# Patient Record
Sex: Female | Born: 1935 | Race: White | Hispanic: No | Marital: Single | Smoking: Never smoker
Health system: Southern US, Community
[De-identification: ages and names within clinical notes are randomized; demographics above are authoritative.]

## PROBLEM LIST (undated history)

## (undated) DIAGNOSIS — C3490 Malignant neoplasm of unspecified part of unspecified bronchus or lung: Secondary | ICD-10-CM

## (undated) DIAGNOSIS — M8580 Other specified disorders of bone density and structure, unspecified site: Secondary | ICD-10-CM

## (undated) DIAGNOSIS — N289 Disorder of kidney and ureter, unspecified: Secondary | ICD-10-CM

## (undated) DIAGNOSIS — H349 Unspecified retinal vascular occlusion: Secondary | ICD-10-CM

## (undated) DIAGNOSIS — E213 Hyperparathyroidism, unspecified: Secondary | ICD-10-CM

## (undated) DIAGNOSIS — J449 Chronic obstructive pulmonary disease, unspecified: Secondary | ICD-10-CM

## (undated) DIAGNOSIS — E7800 Pure hypercholesterolemia, unspecified: Secondary | ICD-10-CM

## (undated) DIAGNOSIS — I701 Atherosclerosis of renal artery: Secondary | ICD-10-CM

## (undated) DIAGNOSIS — I2510 Atherosclerotic heart disease of native coronary artery without angina pectoris: Secondary | ICD-10-CM

## (undated) DIAGNOSIS — G4733 Obstructive sleep apnea (adult) (pediatric): Secondary | ICD-10-CM

## (undated) DIAGNOSIS — I10 Essential (primary) hypertension: Secondary | ICD-10-CM

## (undated) HISTORY — PX: LUNG REMOVAL, PARTIAL: SHX233

## (undated) HISTORY — PX: ABDOMINAL HYSTERECTOMY: SHX81

## (undated) HISTORY — PX: ADRENAL GLAND SURGERY: SHX544

## (undated) HISTORY — PX: TONSILLECTOMY: SUR1361

## (undated) HISTORY — PX: EYE SURGERY: SHX253

## (undated) HISTORY — PX: CORONARY ANGIOPLASTY: SHX604

## (undated) HISTORY — PX: OTHER SURGICAL HISTORY: SHX169

---

## 2016-04-11 ENCOUNTER — Inpatient Hospital Stay
Admission: EM | Admit: 2016-04-11 | Discharge: 2016-04-16 | DRG: 190 | Disposition: A | Discharge status: Skilled Nursing Facility | Payer: MEDICARE | Attending: Internal Medicine | Admitting: Internal Medicine

## 2016-04-11 ENCOUNTER — Emergency Department: Payer: MEDICARE

## 2016-04-11 DIAGNOSIS — G4733 Obstructive sleep apnea (adult) (pediatric): Secondary | ICD-10-CM | POA: Diagnosis not present

## 2016-04-11 DIAGNOSIS — Z880 Allergy status to penicillin: Secondary | ICD-10-CM

## 2016-04-11 DIAGNOSIS — E213 Hyperparathyroidism, unspecified: Secondary | ICD-10-CM | POA: Diagnosis present

## 2016-04-11 DIAGNOSIS — R000 Tachycardia, unspecified: Secondary | ICD-10-CM | POA: Diagnosis present

## 2016-04-11 DIAGNOSIS — Z7951 Long term (current) use of inhaled steroids: Secondary | ICD-10-CM

## 2016-04-11 DIAGNOSIS — I517 Cardiomegaly: Secondary | ICD-10-CM | POA: Diagnosis not present

## 2016-04-11 DIAGNOSIS — J9621 Acute and chronic respiratory failure with hypoxia: Secondary | ICD-10-CM | POA: Diagnosis present

## 2016-04-11 DIAGNOSIS — I2510 Atherosclerotic heart disease of native coronary artery without angina pectoris: Secondary | ICD-10-CM | POA: Diagnosis present

## 2016-04-11 DIAGNOSIS — Z66 Do not resuscitate: Secondary | ICD-10-CM | POA: Diagnosis not present

## 2016-04-11 DIAGNOSIS — N183 Chronic kidney disease, stage 3 (moderate): Secondary | ICD-10-CM | POA: Diagnosis not present

## 2016-04-11 DIAGNOSIS — Z90710 Acquired absence of both cervix and uterus: Secondary | ICD-10-CM

## 2016-04-11 DIAGNOSIS — E7800 Pure hypercholesterolemia, unspecified: Secondary | ICD-10-CM | POA: Diagnosis present

## 2016-04-11 DIAGNOSIS — J440 Chronic obstructive pulmonary disease with acute lower respiratory infection: Secondary | ICD-10-CM | POA: Diagnosis present

## 2016-04-11 DIAGNOSIS — J441 Chronic obstructive pulmonary disease with (acute) exacerbation: Secondary | ICD-10-CM | POA: Diagnosis not present

## 2016-04-11 DIAGNOSIS — Z881 Allergy status to other antibiotic agents status: Secondary | ICD-10-CM

## 2016-04-11 DIAGNOSIS — I447 Left bundle-branch block, unspecified: Secondary | ICD-10-CM | POA: Diagnosis not present

## 2016-04-11 DIAGNOSIS — J209 Acute bronchitis, unspecified: Secondary | ICD-10-CM | POA: Diagnosis not present

## 2016-04-11 DIAGNOSIS — Z8249 Family history of ischemic heart disease and other diseases of the circulatory system: Secondary | ICD-10-CM

## 2016-04-11 DIAGNOSIS — F329 Major depressive disorder, single episode, unspecified: Secondary | ICD-10-CM | POA: Diagnosis present

## 2016-04-11 DIAGNOSIS — Z7982 Long term (current) use of aspirin: Secondary | ICD-10-CM | POA: Diagnosis not present

## 2016-04-11 DIAGNOSIS — Z9981 Dependence on supplemental oxygen: Secondary | ICD-10-CM

## 2016-04-11 DIAGNOSIS — Z85118 Personal history of other malignant neoplasm of bronchus and lung: Secondary | ICD-10-CM

## 2016-04-11 DIAGNOSIS — R0602 Shortness of breath: Secondary | ICD-10-CM

## 2016-04-11 DIAGNOSIS — E785 Hyperlipidemia, unspecified: Secondary | ICD-10-CM | POA: Diagnosis present

## 2016-04-11 DIAGNOSIS — M8580 Other specified disorders of bone density and structure, unspecified site: Secondary | ICD-10-CM | POA: Diagnosis not present

## 2016-04-11 DIAGNOSIS — I440 Atrioventricular block, first degree: Secondary | ICD-10-CM | POA: Diagnosis present

## 2016-04-11 DIAGNOSIS — M6281 Muscle weakness (generalized): Secondary | ICD-10-CM

## 2016-04-11 DIAGNOSIS — I129 Hypertensive chronic kidney disease with stage 1 through stage 4 chronic kidney disease, or unspecified chronic kidney disease: Secondary | ICD-10-CM | POA: Diagnosis present

## 2016-04-11 DIAGNOSIS — J9600 Acute respiratory failure, unspecified whether with hypoxia or hypercapnia: Secondary | ICD-10-CM

## 2016-04-11 DIAGNOSIS — I701 Atherosclerosis of renal artery: Secondary | ICD-10-CM | POA: Diagnosis present

## 2016-04-11 DIAGNOSIS — Z888 Allergy status to other drugs, medicaments and biological substances status: Secondary | ICD-10-CM

## 2016-04-11 HISTORY — DX: Atherosclerotic heart disease of native coronary artery without angina pectoris: I25.10

## 2016-04-11 HISTORY — DX: Essential (primary) hypertension: I10

## 2016-04-11 HISTORY — DX: Unspecified retinal vascular occlusion: H34.9

## 2016-04-11 HISTORY — DX: Malignant neoplasm of unspecified part of unspecified bronchus or lung: C34.90

## 2016-04-11 HISTORY — DX: Pure hypercholesterolemia, unspecified: E78.00

## 2016-04-11 HISTORY — DX: Chronic obstructive pulmonary disease, unspecified: J44.9

## 2016-04-11 HISTORY — DX: Atherosclerosis of renal artery: I70.1

## 2016-04-11 HISTORY — DX: Other specified disorders of bone density and structure, unspecified site: M85.80

## 2016-04-11 HISTORY — DX: Hyperparathyroidism, unspecified: E21.3

## 2016-04-11 HISTORY — DX: Disorder of kidney and ureter, unspecified: N28.9

## 2016-04-11 HISTORY — DX: Obstructive sleep apnea (adult) (pediatric): G47.33

## 2016-04-11 LAB — CBC
HEMATOCRIT: 39 % (ref 35.0–47.0)
Hemoglobin: 13.1 g/dL (ref 12.0–16.0)
MCH: 30.6 pg (ref 26.0–34.0)
MCHC: 33.7 g/dL (ref 32.0–36.0)
MCV: 90.9 fL (ref 80.0–100.0)
Platelets: 165 10*3/uL (ref 150–440)
RBC: 4.29 MIL/uL (ref 3.80–5.20)
RDW: 16.3 % — ABNORMAL HIGH (ref 11.5–14.5)
WBC: 8.7 10*3/uL (ref 3.6–11.0)

## 2016-04-11 LAB — COMPREHENSIVE METABOLIC PANEL
ALBUMIN: 3.9 g/dL (ref 3.5–5.0)
ALT: 20 U/L (ref 14–54)
ANION GAP: 8 (ref 5–15)
AST: 30 U/L (ref 15–41)
Alkaline Phosphatase: 70 U/L (ref 38–126)
BILIRUBIN TOTAL: 0.6 mg/dL (ref 0.3–1.2)
BUN: 32 mg/dL — ABNORMAL HIGH (ref 6–20)
CO2: 31 mmol/L (ref 22–32)
Calcium: 9.8 mg/dL (ref 8.9–10.3)
Chloride: 104 mmol/L (ref 101–111)
Creatinine, Ser: 1 mg/dL (ref 0.44–1.00)
GFR, EST AFRICAN AMERICAN: 58 mL/min — AB (ref >60)
GFR, EST NON AFRICAN AMERICAN: 50 mL/min — AB (ref >60)
Glucose, Bld: 93 mg/dL (ref 65–99)
POTASSIUM: 4.3 mmol/L (ref 3.5–5.1)
Sodium: 143 mmol/L (ref 135–145)
TOTAL PROTEIN: 6.7 g/dL (ref 6.5–8.1)

## 2016-04-11 LAB — INFLUENZA PANEL BY PCR (TYPE A & B)
INFLAPCR: NEGATIVE
INFLBPCR: NEGATIVE

## 2016-04-11 LAB — BRAIN NATRIURETIC PEPTIDE: B NATRIURETIC PEPTIDE 5: 146 pg/mL — AB (ref 0.0–100.0)

## 2016-04-11 LAB — TROPONIN I: Troponin I: <0.03 ng/mL (ref <0.03)

## 2016-04-11 MED ORDER — PROSIGHT PO TABS
1.0000 | ORAL_TABLET | Freq: Every day | ORAL | Status: DC
  Filled 2016-04-11: qty 1

## 2016-04-11 MED ORDER — SODIUM CHLORIDE 0.9% FLUSH: 3.0000 mL | INTRAVENOUS | Status: DC | PRN

## 2016-04-11 MED ORDER — SODIUM CHLORIDE 0.9% FLUSH
3.0000 mL | Freq: Two times a day (BID) | INTRAVENOUS | Status: DC
  Administered 2016-04-11 – 2016-04-16 (×10): 3 mL via INTRAVENOUS

## 2016-04-11 MED ORDER — MAGNESIUM OXIDE 400 MG PO TABS
400.0000 mg | ORAL_TABLET | Freq: Every day | ORAL | Status: DC
  Administered 2016-04-13: 09:00:00 400 mg via ORAL
  Filled 2016-04-11 (×11): qty 1

## 2016-04-11 MED ORDER — BUDESONIDE 0.25 MG/2ML IN SUSP
0.2500 mg | Freq: Two times a day (BID) | RESPIRATORY_TRACT | Status: DC
  Administered 2016-04-11 – 2016-04-16 (×11): 0.25 mg via RESPIRATORY_TRACT
  Filled 2016-04-11 (×11): qty 2

## 2016-04-11 MED ORDER — SODIUM CHLORIDE 0.9% FLUSH
3.0000 mL | Freq: Two times a day (BID) | INTRAVENOUS | Status: DC
  Administered 2016-04-12 – 2016-04-16 (×5): 3 mL via INTRAVENOUS

## 2016-04-11 MED ORDER — ADULT MULTIVITAMIN W/MINERALS CH
1.0000 | ORAL_TABLET | Freq: Every day | ORAL | Status: DC
  Administered 2016-04-11 – 2016-04-16 (×5): 1 via ORAL
  Filled 2016-04-11 (×5): qty 1

## 2016-04-11 MED ORDER — METHYLPREDNISOLONE SODIUM SUCC 125 MG IJ SOLR
125.0000 mg | Freq: Once | INTRAMUSCULAR | Status: AC
  Administered 2016-04-11: 125 mg via INTRAVENOUS
  Filled 2016-04-11: qty 2

## 2016-04-11 MED ORDER — TIOTROPIUM BROMIDE MONOHYDRATE 18 MCG IN CAPS
1.0000 | ORAL_CAPSULE | Freq: Every day | RESPIRATORY_TRACT | Status: DC
  Administered 2016-04-11 – 2016-04-14 (×4): 18 ug via RESPIRATORY_TRACT
  Filled 2016-04-11: qty 5

## 2016-04-11 MED ORDER — ASPIRIN EC 81 MG PO TBEC
81.0000 mg | DELAYED_RELEASE_TABLET | Freq: Every day | ORAL | Status: DC
  Administered 2016-04-11 – 2016-04-16 (×6): 81 mg via ORAL
  Filled 2016-04-11 (×6): qty 1

## 2016-04-11 MED ORDER — MULTI-VITAMINS PO TABS: 1.0000 | ORAL_TABLET | Freq: Every day | ORAL | Status: DC

## 2016-04-11 MED ORDER — ACETAMINOPHEN 650 MG RE SUPP: 650.0000 mg | Freq: Four times a day (QID) | RECTAL | Status: DC | PRN

## 2016-04-11 MED ORDER — ONDANSETRON HCL 4 MG/2ML IJ SOLN: 4.0000 mg | Freq: Four times a day (QID) | INTRAMUSCULAR | Status: DC | PRN

## 2016-04-11 MED ORDER — SERTRALINE HCL 50 MG PO TABS
25.0000 mg | ORAL_TABLET | Freq: Every day | ORAL | Status: DC
  Administered 2016-04-11 – 2016-04-16 (×6): 25 mg via ORAL
  Filled 2016-04-11 (×6): qty 1

## 2016-04-11 MED ORDER — LEVALBUTEROL HCL 1.25 MG/0.5ML IN NEBU
1.2500 mg | INHALATION_SOLUTION | Freq: Four times a day (QID) | RESPIRATORY_TRACT | Status: DC
  Administered 2016-04-11 – 2016-04-14 (×13): 1.25 mg via RESPIRATORY_TRACT
  Filled 2016-04-11 (×13): qty 0.5

## 2016-04-11 MED ORDER — IPRATROPIUM-ALBUTEROL 0.5-2.5 (3) MG/3ML IN SOLN
3.0000 mL | Freq: Once | RESPIRATORY_TRACT | Status: AC
  Administered 2016-04-11: 3 mL via RESPIRATORY_TRACT
  Filled 2016-04-11: qty 3

## 2016-04-11 MED ORDER — SODIUM CHLORIDE 0.9 % IV SOLN: 250.0000 mL | INTRAVENOUS | Status: DC | PRN

## 2016-04-11 MED ORDER — CEPASTAT 14.5 MG MT LOZG
1.0000 | LOZENGE | OROMUCOSAL | Status: DC | PRN
Start: 2016-04-11 — End: 2016-04-16
  Administered 2016-04-11 – 2016-04-16 (×4): 1 via BUCCAL
  Filled 2016-04-11: qty 9

## 2016-04-11 MED ORDER — METOPROLOL SUCCINATE ER 25 MG PO TB24
50.0000 mg | ORAL_TABLET | Freq: Every day | ORAL | Status: DC
  Administered 2016-04-11 – 2016-04-16 (×6): 50 mg via ORAL
  Filled 2016-04-11 (×6): qty 2

## 2016-04-11 MED ORDER — ENOXAPARIN SODIUM 40 MG/0.4ML ~~LOC~~ SOLN
40.0000 mg | SUBCUTANEOUS | Status: DC
  Administered 2016-04-11 – 2016-04-14 (×4): 40 mg via SUBCUTANEOUS
  Filled 2016-04-11 (×4): qty 0.4

## 2016-04-11 MED ORDER — ACETYLCYSTEINE 20 % IN SOLN
4.0000 mL | Freq: Two times a day (BID) | RESPIRATORY_TRACT | Status: DC
  Administered 2016-04-11: 20:00:00 4 mL via RESPIRATORY_TRACT
  Filled 2016-04-11 (×3): qty 4

## 2016-04-11 MED ORDER — NITROGLYCERIN 0.4 MG SL SUBL
0.4000 mg | SUBLINGUAL_TABLET | SUBLINGUAL | Status: DC | PRN
  Administered 2016-04-12 (×2): 0.4 mg via SUBLINGUAL
  Filled 2016-04-11: qty 1

## 2016-04-11 MED ORDER — DOXYCYCLINE HYCLATE 100 MG PO TABS
100.0000 mg | ORAL_TABLET | Freq: Two times a day (BID) | ORAL | Status: DC
  Administered 2016-04-11 – 2016-04-16 (×11): 100 mg via ORAL
  Filled 2016-04-11 (×11): qty 1

## 2016-04-11 MED ORDER — ATORVASTATIN CALCIUM 20 MG PO TABS
80.0000 mg | ORAL_TABLET | Freq: Every day | ORAL | Status: DC
  Administered 2016-04-11 – 2016-04-15 (×5): 80 mg via ORAL
  Filled 2016-04-11 (×5): qty 4

## 2016-04-11 MED ORDER — ACETAMINOPHEN 325 MG PO TABS
650.0000 mg | ORAL_TABLET | Freq: Four times a day (QID) | ORAL | Status: DC | PRN
  Administered 2016-04-11 – 2016-04-12 (×2): 650 mg via ORAL
  Filled 2016-04-11 (×2): qty 2

## 2016-04-11 MED ORDER — AMLODIPINE BESYLATE 5 MG PO TABS
5.0000 mg | ORAL_TABLET | Freq: Every day | ORAL | Status: DC
  Administered 2016-04-11 – 2016-04-16 (×6): 5 mg via ORAL
  Filled 2016-04-11 (×6): qty 1

## 2016-04-11 MED ORDER — GUAIFENESIN-CODEINE 100-10 MG/5ML PO SOLN
10.0000 mL | ORAL | Status: DC | PRN
  Administered 2016-04-11 – 2016-04-15 (×10): 10 mL via ORAL
  Filled 2016-04-11 (×10): qty 10

## 2016-04-11 MED ORDER — METHYLPREDNISOLONE SODIUM SUCC 40 MG IJ SOLR
40.0000 mg | Freq: Three times a day (TID) | INTRAMUSCULAR | Status: DC
  Administered 2016-04-11 – 2016-04-12 (×3): 40 mg via INTRAVENOUS
  Filled 2016-04-11 (×3): qty 1

## 2016-04-11 MED ORDER — ONDANSETRON HCL 4 MG PO TABS: 4.0000 mg | ORAL_TABLET | Freq: Four times a day (QID) | ORAL | Status: DC | PRN

## 2016-04-11 MED ORDER — SPIRONOLACTONE 25 MG PO TABS
25.0000 mg | ORAL_TABLET | Freq: Every day | ORAL | Status: DC
  Administered 2016-04-11 – 2016-04-13 (×3): 25 mg via ORAL
  Filled 2016-04-11 (×3): qty 1

## 2016-04-11 NOTE — ED Triage Notes (Signed)
Pt arrives to ER via POV c/o SOB and pt states she is having COPD exacerbation. Pt saw PCP Friday and given steroids, breathing tx and instructed to come to ER if she got worse. Pt wearing 4L [place] upon arrival.

## 2016-04-11 NOTE — ED Provider Notes (Signed)
[facility] Emergency Department Provider Note ____________________________________________   I have reviewed the triage vital signs and the triage nursing note.  HISTORY  Chief Complaint Shortness of Breath   Historian Patient  HPI Mariah Gross is a 83 y.o. female with a history of COPD and follows at [facility], states that she saw her primary doctor 2 days ago and was started on erythromycin, and prednisone taper for which she is now taken 2 days but is getting worse home. Worsening shortness of breath and wheezing/coughing episodes which take her breath away. She is on home 3 L oxygen at baseline, and with exertion she will sometimes take 4 L.  Symptoms are worsening at home despite home treatment for 2 days. The patient therapy of COPD exacerbation.  Symptoms worse with any kind of exertion, and also lying flat.    Past Medical History:  Diagnosis Date  . Cancer ([place])   . COPD (chronic obstructive pulmonary disease) ([place])   . Coronary artery disease   . Hypercholesteremia   . Hyperparathyroidism ([place])   . Hypertension   . Osteopenia   . Renal disorder     There are no active problems to display for this patient.   History reviewed. No pertinent surgical history.  Prior to Admission medications   Medication Sig Start Date End Date Taking? Authorizing Provider  albuterol (PROVENTIL) (2.5 MG/3ML) 0.083% nebulizer solution Inhale 3 mLs into the lungs every 6 (six) hours as needed. 04/09/16  Yes Historical Provider, MD  amLODipine (NORVASC) 5 MG tablet Take 5 mg by mouth daily. 03/22/16  Yes Historical Provider, MD  aspirin EC 81 MG tablet Take 81 mg by mouth daily.   Yes Historical Provider, MD  atorvastatin (LIPITOR) 80 MG tablet Take 80 mg by mouth daily. 12/23/15  Yes Historical Provider, MD  azithromycin (ZITHROMAX) 250 MG tablet Take 1 tablet by mouth daily. Take 2 tabs on day one, and 1 tab daily for 4 days 04/09/16 04/14/16 Yes Historical Provider,  MD  Fluticasone-Salmeterol (ADVAIR DISKUS) 250-50 MCG/DOSE AEPB Inhale 2 puffs into the lungs every 12 (twelve) hours. 01/14/15  Yes Historical Provider, MD  magnesium oxide (MAG-OX) 400 MG tablet Take 400 mg by mouth daily. 02/01/16 01/31/17 Yes Historical Provider, MD  metoprolol succinate (TOPROL-XL) 50 MG 24 hr tablet Take 50 mg by mouth daily. 06/23/15 06/22/16 Yes Historical Provider, MD  Multiple Vitamin (MULTI-VITAMINS) TABS Take 1 tablet by mouth daily.   Yes Historical Provider, MD  nitroGLYCERIN (NITROSTAT) 0.4 MG SL tablet Place 0.4 mg under the tongue every 5 (five) minutes x 3 doses as needed. 01/22/13 06/24/16 Yes Historical Provider, MD  predniSONE (DELTASONE) 10 MG tablet Take 4 tabs daily for 3 days, then 3 tabs daily x 3 days, then 2 tabs daily for 3 days, then 1 tab daily x 3 days. 04/09/16 04/19/16 Yes Historical Provider, MD  sertraline (ZOLOFT) 50 MG tablet Take 25 mg by mouth daily.  02/03/16 08/01/16 Yes Historical Provider, MD  spironolactone (ALDACTONE) 25 MG tablet Take 25 mg by mouth daily. 01/16/16 01/15/17 Yes Historical Provider, MD  tiotropium (SPIRIVA) 18 MCG inhalation capsule Place 1 capsule into inhaler and inhale daily. 03/17/16 03/17/17 Yes Historical Provider, MD    Allergies  Allergen Reactions  . Ace Inhibitors   . Fluconazole   . Levaquin [Levofloxacin In D5w]   . Penicillins     No family history on file.  Social History Social History  Substance Use Topics  . Smoking status: Never  Smoker  . Smokeless tobacco: Never Used  . Alcohol use No    Review of Systems  Constitutional: Negative for fever. Eyes: Negative for visual changes. ENT: Negative for sore throat. Cardiovascular: Negative for chest pain. Respiratory: Positive for shortness of breath. Gastrointestinal: Negative for abdominal pain, vomiting and diarrhea. Genitourinary: Negative for dysuria. Musculoskeletal: Negative for back pain. Skin: Negative for rash. Neurological: Negative for  headache. 10 point Review of Systems otherwise negative ____________________________________________   PHYSICAL EXAM:  VITAL SIGNS: ED Triage Vitals [04/11/16 0730]  Enc Vitals Group     BP (!) 163/88     Pulse Rate 92     Resp (!) 24     Temp 97.8 F (36.6 C)     Temp Source Oral     SpO2 99 %     Weight 199 lb (90.3 kg)     Height '5\' 3"'$  (1.6 m)     Head Circumference      Peak Flow      Pain Score 5     Pain Loc      Pain Edu?      Excl. in [place]?      Constitutional: Alert and oriented. No thyroid distress at rest until she goes into a coughing/wheezing. It. HEENT   Head: Normocephalic and atraumatic.      Eyes: Conjunctivae are normal. PERRL. Normal extraocular movements.      Ears:         Nose: No congestion/rhinnorhea.   Mouth/Throat: Mucous membranes are moist.   Neck: No stridor. Cardiovascular/Chest: Normal rate, regular rhythm.  No murmurs, rubs, or gallops. Respiratory: Normal respiratory effort without tachypnea nor retractions.  She does however have moderate to severe wheezing especially when she starts coughing she is gasping to catch her breath. Gastrointestinal: Soft. No distention, no guarding, no rebound. Nontender.   Genitourinary/rectal:Deferred Musculoskeletal: Nontender with normal range of motion in all extremities. No joint effusions.  No lower extremity tenderness. Trace lower extremity edema. Neurologic:  Normal speech and language. No gross or focal neurologic deficits are appreciated. Skin:  Skin is warm, dry and intact. No rash noted. Psychiatric: Mood and affect are normal. Speech and behavior are normal. Patient exhibits appropriate insight and judgment.   ____________________________________________  LABS (pertinent positives/negatives)  Labs Reviewed  CBC - Abnormal; Notable for the following:       Result Value   RDW 16.3 (*)    All other components within normal limits  COMPREHENSIVE METABOLIC PANEL - Abnormal; Notable  for the following:    BUN 32 (*)    GFR calc non Af Amer 50 (*)    GFR calc Af Amer 58 (*)    All other components within normal limits  BRAIN NATRIURETIC PEPTIDE - Abnormal; Notable for the following:    B Natriuretic Peptide 146.0 (*)    All other components within normal limits  TROPONIN I  INFLUENZA PANEL BY PCR (TYPE A & B, H1N1)    ____________________________________________    EKG I, Lisa Roca, MD, the attending physician have personally viewed and interpreted all ECGs.  83 bpm. Normal sinus rhythm with first degree AV block. Occasional PVCs. Left axis deviation. ____________________________________________  RADIOLOGY All Xrays were viewed by me. Imaging interpreted by Radiologist.  Chest x-ray:  IMPRESSION: COPD/chronic bronchitis.  Cardiomegaly without congestive failure.  Trace right pleural fluid or thickening with adjacent minimal airspace disease, favored to represent atelectasis. __________________________________________  PROCEDURES  Procedure(s) performed: None  Critical Care performed: None  ____________________________________________   ED COURSE / ASSESSMENT AND PLAN  Pertinent labs & imaging results that were available during my care of the patient were reviewed by me and considered in my medical decision making (see chart for details).   Ms. Drema Dallas is here with COPD exacerbation, failing outpatient therapy with persistent and worsening dyspnea and wheezing despite 2 days already of outpatient steroids and erythromycin.  She is on home 3 L nasal cannula and is 97%, but does go into spells of wheezing which is extremely chondral. She is hardly able to get up off the side of bed without having severe shortness of breath.  I am adding on influenza testing although she is denying fevers or body aches.  In terms of treatment in the ER, she is receiving Solu-Medrol and 3 DuoNeb treatments.   CONSULTATIONS:   Hospitalist for  admission.   Patient / Family / Caregiver informed of clinical course, medical decision-making process, and agree with plan.  ___________________________________________   FINAL CLINICAL IMPRESSION(S) / ED DIAGNOSES   Final diagnoses:  COPD exacerbation ([place])              Note: This dictation was prepared with Dragon dictation. Any transcriptional errors that result from this process are unintentional    Lisa Roca, MD 04/11/16 1007

## 2016-04-11 NOTE — ED Notes (Signed)
Pt transported to xray 

## 2016-04-11 NOTE — H&P (Signed)
[place] [facility] [place] NAME: Mariah Gross    MR#:  [id number]  DATE OF BIRTH:  [birth year]  DATE OF ADMISSION:  04/11/2016  PRIMARY CARE PHYSICIAN: Marcelina Morel, MD   REQUESTING/REFERRING PHYSICIAN: Dr. Lisa Roca   CHIEF COMPLAINT:   Chief Complaint  Patient presents with  . Shortness of Breath    HISTORY OF PRESENT ILLNESS: Anniece Gross  is a 83 y.o. female with a known history of COPD on chronic oxygen therapy ranging from 3-4 L based on activity, coronary artery disease, hypercholesterolemia, hyperparathyroidism, essential hypertension, history lungs cancer, obstructive sleep apnea  intolerant with CPAP was coming to the hospital with complaining of shortness of breath ongoing for the past 3-4 days. Patient has had progressive shortness of breath at rest and activity. She also describes a rattling sound in her lungs. She has had some cough which is yellow in color. She also complains of swelling of her lower extremity. Denies any nausea vomiting or diarrhea. She complains of chest pain with coughing PAST MEDICAL HISTORY:   Past Medical History:  Diagnosis Date  . COPD (chronic obstructive pulmonary disease) ([place])   . Coronary artery disease   . Hypercholesteremia   . Hyperparathyroidism ([place])   . Hypertension   . Lung cancer ([place])   . OSA (obstructive sleep apnea)   . Osteopenia   . Renal artery stenosis ([place])   . Renal disorder   . Retinal artery occlusion     PAST SURGICAL HISTORY: Past Surgical History:  Procedure Laterality Date  . ABDOMINAL HYSTERECTOMY    . ADRENAL GLAND SURGERY    . CORONARY ANGIOPLASTY    . EYE SURGERY    . hemmroid    . knee replacment Right   . LUNG REMOVAL, PARTIAL    . renal stone    . TONSILLECTOMY      SOCIAL HISTORY:  Social History  Substance Use Topics  . Smoking status: Never Smoker  . Smokeless tobacco: Never Used  . Alcohol use No    FAMILY HISTORY:  Family History  Problem  Relation Age of Onset  . Hypertension Father     DRUG ALLERGIES:  Allergies  Allergen Reactions  . Ace Inhibitors   . Fluconazole   . Levaquin [Levofloxacin In D5w]   . Penicillins     REVIEW OF SYSTEMS:   CONSTITUTIONAL: No fever, fatigue or weakness.  EYES: No blurred or double vision.  EARS, NOSE, AND THROAT: No tinnitus or ear pain.  RESPIRATORY: Positive cough, positive shortness of breath, positive wheezing no hemoptysis.  CARDIOVASCULAR: Positive with coughing chest pain, positive orthopnea, positive edema.  GASTROINTESTINAL: No nausea, vomiting, diarrhea or abdominal pain.  GENITOURINARY: No dysuria, hematuria.  ENDOCRINE: No polyuria, nocturia,  HEMATOLOGY: No anemia, easy bruising or bleeding SKIN: No rash or lesion. MUSCULOSKELETAL: No joint pain or arthritis.   NEUROLOGIC: No tingling, numbness, weakness.  PSYCHIATRY: No anxiety or depression.   MEDICATIONS AT HOME:  Prior to Admission medications   Medication Sig Start Date End Date Taking? Authorizing Provider  albuterol (PROVENTIL) (2.5 MG/3ML) 0.083% nebulizer solution Inhale 3 mLs into the lungs every 6 (six) hours as needed. 04/09/16  Yes Historical Provider, MD  amLODipine (NORVASC) 5 MG tablet Take 5 mg by mouth daily. 03/22/16  Yes Historical Provider, MD  aspirin EC 81 MG tablet Take 81 mg by mouth daily.   Yes Historical Provider, MD  atorvastatin (LIPITOR) 80 MG tablet Take 80 mg by  mouth daily. 12/23/15  Yes Historical Provider, MD  azithromycin (ZITHROMAX) 250 MG tablet Take 1 tablet by mouth daily. Take 2 tabs on day one, and 1 tab daily for 4 days 04/09/16 04/14/16 Yes Historical Provider, MD  Fluticasone-Salmeterol (ADVAIR DISKUS) 250-50 MCG/DOSE AEPB Inhale 2 puffs into the lungs every 12 (twelve) hours. 01/14/15  Yes Historical Provider, MD  magnesium oxide (MAG-OX) 400 MG tablet Take 400 mg by mouth daily. 02/01/16 01/31/17 Yes Historical Provider, MD  metoprolol succinate (TOPROL-XL) 50 MG 24 hr  tablet Take 50 mg by mouth daily. 06/23/15 06/22/16 Yes Historical Provider, MD  Multiple Vitamin (MULTI-VITAMINS) TABS Take 1 tablet by mouth daily.   Yes Historical Provider, MD  nitroGLYCERIN (NITROSTAT) 0.4 MG SL tablet Place 0.4 mg under the tongue every 5 (five) minutes x 3 doses as needed. 01/22/13 06/24/16 Yes Historical Provider, MD  predniSONE (DELTASONE) 10 MG tablet Take 4 tabs daily for 3 days, then 3 tabs daily x 3 days, then 2 tabs daily for 3 days, then 1 tab daily x 3 days. 04/09/16 04/19/16 Yes Historical Provider, MD  sertraline (ZOLOFT) 50 MG tablet Take 25 mg by mouth daily.  02/03/16 08/01/16 Yes Historical Provider, MD  spironolactone (ALDACTONE) 25 MG tablet Take 25 mg by mouth daily. 01/16/16 01/15/17 Yes Historical Provider, MD  tiotropium (SPIRIVA) 18 MCG inhalation capsule Place 1 capsule into inhaler and inhale daily. 03/17/16 03/17/17 Yes Historical Provider, MD      PHYSICAL EXAMINATION:   VITAL SIGNS: Blood pressure (!) 176/108, pulse 79, temperature 97.8 F (36.6 C), temperature source Oral, resp. rate (!) 21, height '5\' 3"'$  (1.6 m), weight 199 lb (90.3 kg), SpO2 97 %.  GENERAL:  83 y.o.-year-old patient lying in the bed with no acute distress.  EYES: Pupils equal, round, reactive to light and accommodation. No scleral icterus. Extraocular muscles intact.  HEENT: Head atraumatic, normocephalic. Oropharynx and nasopharynx clear.  NECK:  Supple, no jugular venous distention. No thyroid enlargement, no tenderness.  LUNGS: Bilateral wheezing and rhonchus breath sounds throughout both lungs some associated muscle usage CARDIOVASCULAR: S1, S2 normal. No murmurs, rubs, or gallops.  ABDOMEN: Soft, nontender, nondistended. Bowel sounds present. No organomegaly or mass.  EXTREMITIES:  cyanosis, or clubbing. 1+ pedal edema NEUROLOGIC: Cranial nerves II through XII are intact. Muscle strength 5/5 in all extremities. Sensation intact. Gait not checked.  PSYCHIATRIC: The patient is  alert and oriented x 3.  SKIN: No obvious rash, lesion, or ulcer.   LABORATORY PANEL:   CBC  Recent Labs Lab 04/11/16 0748  WBC 8.7  HGB 13.1  HCT 39.0  PLT 165  MCV 90.9  MCH 30.6  MCHC 33.7  RDW 16.3*   ------------------------------------------------------------------------------------------------------------------  Chemistries   Recent Labs Lab 04/11/16 0748  NA 143  K 4.3  CL 104  CO2 31  GLUCOSE 93  BUN 32*  CREATININE 1.00  CALCIUM 9.8  AST 30  ALT 20  ALKPHOS 70  BILITOT 0.6   ------------------------------------------------------------------------------------------------------------------ estimated creatinine clearance is 44.7 mL/min (by C-G formula based on SCr of 1 mg/dL). ------------------------------------------------------------------------------------------------------------------ No results for input(s): TSH, T4TOTAL, T3FREE, THYROIDAB in the last 72 hours.  Invalid input(s): FREET3   Coagulation profile No results for input(s): INR, PROTIME in the last 168 hours. ------------------------------------------------------------------------------------------------------------------- No results for input(s): DDIMER in the last 72 hours. -------------------------------------------------------------------------------------------------------------------  Cardiac Enzymes  Recent Labs Lab 04/11/16 0748  TROPONINI <0.03   ------------------------------------------------------------------------------------------------------------------ Invalid input(s): POCBNP  ---------------------------------------------------------------------------------------------------------------  Urinalysis No results found for: COLORURINE, APPEARANCEUR, LABSPEC,  PHURINE, GLUCOSEU, HGBUR, BILIRUBINUR, KETONESUR, PROTEINUR, UROBILINOGEN, NITRITE, LEUKOCYTESUR   RADIOLOGY: Dg Chest 2 View  Result Date: 04/11/2016 CLINICAL DATA:  c/o SOB and pt states she is having  COPD exacerbation. Pt saw PCP Friday and given steroids, breathing tx and instructed to come to ER if she got worse. Hx of lung CA with lobectomy 2012(?). EXAM: CHEST  2 VIEW COMPARISON:  None. FINDINGS: Hyperinflation. Lateral view degraded by patient arm position. Midline trachea. Moderate cardiomegaly with a tortuous descending thoracic aorta. Right hemidiaphragm elevation is mild. Right pleural thickening or minimal fluid blunts the costophrenic angle. No pneumothorax. No congestive failure. Diffuse peribronchial thickening. No lobar consolidation. Suspect minimal atelectasis at the right lung base laterally. IMPRESSION: COPD/chronic bronchitis. Cardiomegaly without congestive failure. Trace right pleural fluid or thickening with adjacent minimal airspace disease, favored to represent atelectasis. Electronically Signed   By: Abigail Miyamoto M.D.   On: 04/11/2016 08:39    EKG: Orders placed or performed during the hospital encounter of 04/11/16  . ED EKG  . ED EKG  . EKG 12-Lead  . EKG 12-Lead    IMPRESSION AND PLAN: Patient is a 83 year old with COPD chronic respiratory failure presenting with shortness of breath  1. Acute on chronic respiratory failure due to acute on chronic COPD exasperation as well as acute bronchitis We'll treat patient with IV Solu-Medrol, nebulizer therapy and Mucomyst Patient states she is intolerant of Mucinex makes her nauseous I will also start her on doxycycline for acute bronchitis she was treated with azithromycin with no significant improvement  2. Orthopnea and lower extremity swelling We'll obtain echocardiogram of the heart Continue therapy with sprinolacton as doing at home  3. Essential hypertension Continue therapy with amlodipine and metoprolol  4. Sinus tachycardia monitor heart rate I will have to use Xopenex instead of albuterol Continue metoprolol   5. Depression continue sertraline  6. Hyperlipidemia unspecified continue atorvastatin  7.  Miscellaneous Lovenox for DVT prophylaxis     All the records are reviewed and case discussed with ED provider. Management plans discussed with the patient, family and they are in agreement.  CODE STATUS: Code Status History    This patient does not have a recorded code status. Please follow your organizational policy for patients in this situation.       TOTAL TIME TAKING CARE OF THIS PATIENT: 55 minutes.    Dustin Flock M.D on 04/11/2016 at 10:38 AM  Between 7am to 6pm - Pager - [phone]  After 6pm go to [email or web address] - password [email or web address] [place] [facility]  Office  [phone]  CC: Primary care physician; Marcelina Morel, MD

## 2016-04-12 ENCOUNTER — Inpatient Hospital Stay
Admit: 2016-04-12 | Discharge: 2016-04-12 | Disposition: A | Discharge status: Home / Self Care | Payer: MEDICARE | Attending: Internal Medicine | Admitting: Internal Medicine

## 2016-04-12 ENCOUNTER — Other Ambulatory Visit: Payer: Self-pay

## 2016-04-12 DIAGNOSIS — R0602 Shortness of breath: Secondary | ICD-10-CM | POA: Diagnosis not present

## 2016-04-12 DIAGNOSIS — J441 Chronic obstructive pulmonary disease with (acute) exacerbation: Secondary | ICD-10-CM | POA: Diagnosis not present

## 2016-04-12 LAB — ECHOCARDIOGRAM COMPLETE
Height: 63 in
WEIGHTICAEL: 3158.4 [oz_av]

## 2016-04-12 LAB — TROPONIN I
Troponin I: 0.09 ng/mL (ref <0.03)
Troponin I: <0.03 ng/mL (ref <0.03)
Troponin I: 0.03 ng/mL (ref <0.03)
Troponin I: 0.03 ng/mL (ref <0.03)

## 2016-04-12 LAB — CBC
HCT: 37.2 % (ref 35.0–47.0)
Hemoglobin: 12.5 g/dL (ref 12.0–16.0)
MCH: 30.3 pg (ref 26.0–34.0)
MCHC: 33.5 g/dL (ref 32.0–36.0)
MCV: 90.3 fL (ref 80.0–100.0)
PLATELETS: 165 10*3/uL (ref 150–440)
RBC: 4.12 MIL/uL (ref 3.80–5.20)
RDW: 16.1 % — AB (ref 11.5–14.5)
WBC: 16.5 10*3/uL — AB (ref 3.6–11.0)

## 2016-04-12 LAB — BASIC METABOLIC PANEL
ANION GAP: 7 (ref 5–15)
BUN: 34 mg/dL — ABNORMAL HIGH (ref 6–20)
CALCIUM: 9.6 mg/dL (ref 8.9–10.3)
CO2: 31 mmol/L (ref 22–32)
Chloride: 104 mmol/L (ref 101–111)
Creatinine, Ser: 0.86 mg/dL (ref 0.44–1.00)
GFR calc Af Amer: >60 mL/min (ref >60)
GLUCOSE: 151 mg/dL — AB (ref 65–99)
Potassium: 5.2 mmol/L — ABNORMAL HIGH (ref 3.5–5.1)
Sodium: 142 mmol/L (ref 135–145)

## 2016-04-12 MED ORDER — ACETYLCYSTEINE 20 % IN SOLN
4.0000 mL | Freq: Two times a day (BID) | RESPIRATORY_TRACT | Status: DC
  Administered 2016-04-12 – 2016-04-14 (×5): 4 mL via RESPIRATORY_TRACT
  Filled 2016-04-12 (×4): qty 4

## 2016-04-12 MED ORDER — MORPHINE SULFATE (PF) 2 MG/ML IV SOLN
1.0000 mg | Freq: Once | INTRAVENOUS | Status: AC
  Administered 2016-04-12: 07:00:00 1 mg via INTRAVENOUS
  Filled 2016-04-12: qty 1

## 2016-04-12 MED ORDER — SODIUM POLYSTYRENE SULFONATE 15 GM/60ML PO SUSP
15.0000 g | Freq: Once | ORAL | Status: AC
  Administered 2016-04-12: 11:00:00 15 g via ORAL
  Filled 2016-04-12: qty 60

## 2016-04-12 MED ORDER — METHYLPREDNISOLONE SODIUM SUCC 40 MG IJ SOLR
40.0000 mg | Freq: Four times a day (QID) | INTRAMUSCULAR | Status: DC
  Administered 2016-04-12 – 2016-04-14 (×10): 40 mg via INTRAVENOUS
  Filled 2016-04-12 (×10): qty 1

## 2016-04-12 NOTE — Progress Notes (Addendum)
Pt BP 186/84 HR 85 at 5:40am. CNA notified me that pt was reporting chest and back pain. Pt rated pain 10/10. PRN order for Ntg sl was given, improvement reported after 1st dose to 5/10 and BP 139/58 HR 89. 2nd dose Ntg sl given, pt reported no change in pain after 2nd dose remained 5/10, BP 133/55 HR 86. 0555 Dr. Marcille Blanco paged. [phone] Dr. Marcille Blanco called to discuss patient and new orders entered by MD for EKG, chest xray, troponin, and morphine. Dr. Marcille Blanco up to see the patient and reviewed EKG. 0631 Morphine IV one time order given for chest/back pain 5/10. 0640 Dr. Marcille Blanco notified of critical troponin level of 0.09.  At [phone] pt reported chest and back pain 4/10.  Patient instructed to call if chest pain becomes worse or if she became short of breath. Patient verbalized understanding, call bell placed in patients hand.

## 2016-04-12 NOTE — Care Management (Signed)
Admitted to this facility with the diagnosis of COPD. Just moved into [facility] x 3 days. Daughter is Lulu Riding [phone]). Last eas in Dr. Meredite's office 04/09/16. Chronic Home oxygen x 4 years through [place]. 4 liters at night and 3 liters per nasal cannula during the day continuous. No home Health. Skilled Nursing following knee replacement at [facility] about 7 years ago. CPAP in the home, but doesn't use. Prescriptions are filled at [facility] on [address], [place]. No falls. Good appetite. Takes care of all basic activities of daily living herself, does drive.  States she will be going to live with her daughter when discharged. Shelbie Ammons RN MSN CCM Care Management

## 2016-04-12 NOTE — Progress Notes (Signed)
[facility] [place] NAME: Mariah Gross    MR#:  [id number]  DATE OF BIRTH:  1930-03-03  SUBJECTIVE:  CHIEF COMPLAINT:  Patient is not feeling better. Coughing and wheezing still  REVIEW OF SYSTEMS:  CONSTITUTIONAL: No fever, fatigue or weakness.  EYES: No blurred or double vision.  EARS, NOSE, AND THROAT: No tinnitus or ear pain.  RESPIRATORY: Reporting cough, exertional shortness of breath and wheezing. Denies any hemoptysis CARDIOVASCULAR: No chest pain, orthopnea, edema.  GASTROINTESTINAL: No nausea, vomiting, diarrhea or abdominal pain.  GENITOURINARY: No dysuria, hematuria.  ENDOCRINE: No polyuria, nocturia,  HEMATOLOGY: No anemia, easy bruising or bleeding SKIN: No rash or lesion. MUSCULOSKELETAL: No joint pain or arthritis.   NEUROLOGIC: No tingling, numbness, weakness.  PSYCHIATRY: No anxiety or depression.   DRUG ALLERGIES:   Allergies  Allergen Reactions  . Ace Inhibitors   . Fluconazole   . Levaquin [Levofloxacin In D5w]   . Penicillins     VITALS:  Blood pressure 135/77, pulse 75, temperature 98.3 F (36.8 C), temperature source Oral, resp. rate (!) 24, height '5\' 3"'$  (1.6 m), weight 89.5 kg (197 lb 6.4 oz), SpO2 98 %.  PHYSICAL EXAMINATION:  GENERAL:  83 y.o.-year-old patient lying in the bed with no acute distress.  EYES: Pupils equal, round, reactive to light and accommodation. No scleral icterus. Extraocular muscles intact.  HEENT: Head atraumatic, normocephalic. Oropharynx and nasopharynx clear.  NECK:  Supple, no jugular venous distention. No thyroid enlargement, no tenderness.  LUNGS: Diminished breath sounds bilaterally with diffuse wheezing and bronchial breath sounds No use of accessory muscles of respiration.  CARDIOVASCULAR: S1, S2 normal. No murmurs, rubs, or gallops.  ABDOMEN: Soft, nontender, nondistended. Bowel sounds present. No organomegaly or mass.  EXTREMITIES: No pedal edema, cyanosis, or  clubbing.  NEUROLOGIC: Cranial nerves II through XII are intact. Muscle strength 5/5 in all extremities. Sensation intact. Gait not checked.  PSYCHIATRIC: The patient is alert and oriented x 3.  SKIN: No obvious rash, lesion, or ulcer.    LABORATORY PANEL:   CBC  Recent Labs Lab 04/12/16 0446  WBC 16.5*  HGB 12.5  HCT 37.2  PLT 165   ------------------------------------------------------------------------------------------------------------------  Chemistries   Recent Labs Lab 04/11/16 0748 04/12/16 0446  NA 143 142  K 4.3 5.2*  CL 104 104  CO2 31 31  GLUCOSE 93 151*  BUN 32* 34*  CREATININE 1.00 0.86  CALCIUM 9.8 9.6  AST 30  --   ALT 20  --   ALKPHOS 70  --   BILITOT 0.6  --    ------------------------------------------------------------------------------------------------------------------  Cardiac Enzymes  Recent Labs Lab 04/12/16 0446  TROPONINI 0.09*   ------------------------------------------------------------------------------------------------------------------  RADIOLOGY:  Dg Chest 2 View  Result Date: 04/11/2016 CLINICAL DATA:  c/o SOB and pt states she is having COPD exacerbation. Pt saw PCP Friday and given steroids, breathing tx and instructed to come to ER if she got worse. Hx of lung CA with lobectomy 2012(?). EXAM: CHEST  2 VIEW COMPARISON:  None. FINDINGS: Hyperinflation. Lateral view degraded by patient arm position. Midline trachea. Moderate cardiomegaly with a tortuous descending thoracic aorta. Right hemidiaphragm elevation is mild. Right pleural thickening or minimal fluid blunts the costophrenic angle. No pneumothorax. No congestive failure. Diffuse peribronchial thickening. No lobar consolidation. Suspect minimal atelectasis at the right lung base laterally. IMPRESSION: COPD/chronic bronchitis. Cardiomegaly without congestive failure. Trace right pleural fluid or thickening with adjacent minimal airspace disease, favored to represent  atelectasis.  Electronically Signed   By: Abigail Miyamoto M.D.   On: 04/11/2016 08:39    EKG:   Orders placed or performed during the hospital encounter of 04/11/16  . ED EKG  . ED EKG  . EKG 12-Lead  . EKG 12-Lead  . EKG 12-Lead  . EKG 12-Lead    ASSESSMENT AND PLAN:   Patient is a 83 year old with COPD chronic respiratory failure presenting with shortness of breath  1. Acute on chronic respiratory failure due to acute on chronic COPD exasperation as well as acute bronchitis Clinically not feeling better today We'll continue with IV Solu-Medrol increase the frequency to every 6 hours. Continue nebulizer therapy and Mucomyst Patient states she is intolerant of Mucinex makes her nauseous Continue on doxycycline for acute bronchitis she was treated with azithromycin with no significant improvement  2. Orthopnea and lower extremity swelling Echo done and results are pending Continue therapy with sprinolacton as doing at home Encouraged patient to keep her legs elevated  3. Essential hypertension Continue therapy with amlodipine and metoprolol  4. Sinus tachycardia monitor heart rate I will have to use Xopenex instead of albuterol Continue metoprolol   5. Depression continue sertraline  6. Hyperlipidemia unspecified continue atorvastatin  7. Miscellaneous Lovenox for DVT prophylaxis     All the records are reviewed and case discussed with Care Management/Social Workerr. Management plans discussed with the patient, family and they are in agreement.  CODE STATUS: fc   TOTAL TIME TAKING CARE OF THIS PATIENT: 36 minutes.   POSSIBLE D/C IN 2-3 DAYS, DEPENDING ON CLINICAL CONDITION.  Note: This dictation was prepared with Dragon dictation along with smaller phrase technology. Any transcriptional errors that result from this process are unintentional.   Nicholes Mango M.D on 04/12/2016 at 9:38 AM  Between 7am to 6pm - Pager - [phone] After 6pm go to [email or web address]  - password [email or web address] [facility]  Office  [phone]  CC: Primary care physician; Marcelina Morel, MD

## 2016-04-12 NOTE — Progress Notes (Signed)
*  PRELIMINARY RESULTS* Echocardiogram 2D Echocardiogram has been performed.  Mariah Gross 04/12/2016, 8:24 AM

## 2016-04-13 ENCOUNTER — Inpatient Hospital Stay: Payer: MEDICARE

## 2016-04-13 DIAGNOSIS — R0602 Shortness of breath: Secondary | ICD-10-CM | POA: Diagnosis not present

## 2016-04-13 DIAGNOSIS — J441 Chronic obstructive pulmonary disease with (acute) exacerbation: Secondary | ICD-10-CM | POA: Diagnosis not present

## 2016-04-13 LAB — POTASSIUM: POTASSIUM: 4.5 mmol/L (ref 3.5–5.1)

## 2016-04-13 NOTE — Progress Notes (Signed)
[facility] [place] NAME: Mariah Gross    MR#:  [id number]  DATE OF BIRTH:  [birth year]  SUBJECTIVE:  CHIEF COMPLAINT:  Patient is  Coughing and wheezing better  REVIEW OF SYSTEMS:  CONSTITUTIONAL: No fever, fatigue or weakness.  EYES: No blurred or double vision.  EARS, NOSE, AND THROAT: No tinnitus or ear pain.  RESPIRATORY: Reporting cough, exertional shortness of breath and some wheezing. Denies any hemoptysis CARDIOVASCULAR: No chest pain, orthopnea, edema.  GASTROINTESTINAL: No nausea, vomiting, diarrhea or abdominal pain.  GENITOURINARY: No dysuria, hematuria.  ENDOCRINE: No polyuria, nocturia,  HEMATOLOGY: No anemia, easy bruising or bleeding SKIN: No rash or lesion. MUSCULOSKELETAL: No joint pain or arthritis.   NEUROLOGIC: No tingling, numbness, weakness.  PSYCHIATRY: No anxiety or depression.   DRUG ALLERGIES:   Allergies  Allergen Reactions  . Ace Inhibitors   . Fluconazole   . Levaquin [Levofloxacin In D5w]   . Penicillins     VITALS:  Blood pressure (!) 152/69, pulse 76, temperature 97.7 F (36.5 C), temperature source Oral, resp. rate 16, height '5\' 3"'$  (1.6 m), weight 89.5 kg (197 lb 6.4 oz), SpO2 96 %.  PHYSICAL EXAMINATION:  GENERAL:  83 y.o.-year-old patient lying in the bed with no acute distress.  EYES: Pupils equal, round, reactive to light and accommodation. No scleral icterus. Extraocular muscles intact.  HEENT: Head atraumatic, normocephalic. Oropharynx and nasopharynx clear.  NECK:  Supple, no jugular venous distention. No thyroid enlargement, no tenderness.  LUNGS: Diminished breath sounds bilaterally with min wheezing and bronchial breath sounds No use of accessory muscles of respiration.  CARDIOVASCULAR: S1, S2 normal. No murmurs, rubs, or gallops.  ABDOMEN: Soft, nontender, nondistended. Bowel sounds present. No organomegaly or mass.  EXTREMITIES: No pedal edema, cyanosis, or clubbing.   NEUROLOGIC: Cranial nerves II through XII are intact. Muscle strength 5/5 in all extremities. Sensation intact. Gait not checked.  PSYCHIATRIC: The patient is alert and oriented x 3.  SKIN: No obvious rash, lesion, or ulcer.    LABORATORY PANEL:   CBC  Recent Labs Lab 04/12/16 0446  WBC 16.5*  HGB 12.5  HCT 37.2  PLT 165   ------------------------------------------------------------------------------------------------------------------  Chemistries   Recent Labs Lab 04/11/16 0748 04/12/16 0446  NA 143 142  K 4.3 5.2*  CL 104 104  CO2 31 31  GLUCOSE 93 151*  BUN 32* 34*  CREATININE 1.00 0.86  CALCIUM 9.8 9.6  AST 30  --   ALT 20  --   ALKPHOS 70  --   BILITOT 0.6  --    ------------------------------------------------------------------------------------------------------------------  Cardiac Enzymes  Recent Labs Lab 04/12/16 1942  TROPONINI 0.03*   ------------------------------------------------------------------------------------------------------------------  RADIOLOGY:  Dg Chest Port 1 View  Result Date: 04/13/2016 CLINICAL DATA:  83 year old female with shortness of breath. COPD exacerbation. Subsequent encounter. EXAM: PORTABLE CHEST 1 VIEW COMPARISON:  04/11/2016. FINDINGS: Mild cardiomegaly.  Coronary artery calcifications. Calcified slightly tortuous aorta. Pulmonary vascular prominence most notable centrally. Limited evaluation of retrocardiac region otherwise no segmental consolidation noted. No plain film evidence of pulmonary malignancy. Blunting right costophrenic angle stable. Question small pleural effusion versus scarring. No remote exams available to determine if this represents a change. Scoliosis thoracic spine convex right. IMPRESSION: Overall similar appearance to 04/11/2016 exam as noted above. Electronically Signed   By: Genia Del M.D.   On: 04/13/2016 08:16    EKG:   Orders placed or performed during the hospital encounter of  04/11/16  .  ED EKG  . ED EKG  . EKG 12-Lead  . EKG 12-Lead  . EKG 12-Lead  . EKG 12-Lead    ASSESSMENT AND PLAN:   Patient is a 83 year old with COPD chronic respiratory failure presenting with shortness of breath  1. Acute on chronic respiratory failure due to acute on chronic COPD exasperation as well as acute bronchitis Clinically improving We'll continue with IV Solu-Medrol taper the frequency   Continue nebulizer therapy and Mucomyst Patient states she is intolerant of Mucinex makes her nauseous Continue on doxycycline for acute bronchitis she was treated with azithromycin with no significant improvement IS  2. Orthopnea and lower extremity swelling Echo - 45 -50 % EF Continue therapy with sprinolactone if rpt POt is in the nml range Encouraged patient to keep  legs elevated   3. Essential hypertension Continue therapy with amlodipine and metoprolol  4. Sinus tachycardia monitor heart rate I will have to use Xopenex instead of albuterol Continue metoprolol   5. Depression continue sertraline  6. Hyperlipidemia unspecified continue atorvastatin  7. Miscellaneous Lovenox for DVT prophylaxis   PT consult  All the records are reviewed and case discussed with Care Management/Social Workerr. Management plans discussed with the patient, family and they are in agreement.  CODE STATUS: fc   TOTAL TIME TAKING CARE OF THIS PATIENT: 36 minutes.   POSSIBLE D/C IN 1-2 DAYS, DEPENDING ON CLINICAL CONDITION.  Note: This dictation was prepared with Dragon dictation along with smaller phrase technology. Any transcriptional errors that result from this process are unintentional.   Nicholes Mango M.D on 04/13/2016 at 7:00 PM  Between 7am to 6pm - Pager - [phone] After 6pm go to [email or web address] - password [email or web address] [facility]  Office  [phone]  CC: Primary care physician; Marcelina Morel, MD

## 2016-04-13 NOTE — Care Management Important Message (Signed)
Important Message  Patient Details  Name: Mariah Gross MRN: [id number] Date of Birth: [birth year]   Medicare Important Message Given:  Yes    Shelbie Ammons, RN 04/13/2016, 9:09 AM

## 2016-04-14 DIAGNOSIS — J9601 Acute respiratory failure with hypoxia: Secondary | ICD-10-CM | POA: Diagnosis not present

## 2016-04-14 DIAGNOSIS — J441 Chronic obstructive pulmonary disease with (acute) exacerbation: Secondary | ICD-10-CM | POA: Diagnosis not present

## 2016-04-14 DIAGNOSIS — J440 Chronic obstructive pulmonary disease with acute lower respiratory infection: Secondary | ICD-10-CM | POA: Diagnosis not present

## 2016-04-14 DIAGNOSIS — J209 Acute bronchitis, unspecified: Secondary | ICD-10-CM

## 2016-04-14 DIAGNOSIS — R0602 Shortness of breath: Secondary | ICD-10-CM | POA: Diagnosis not present

## 2016-04-14 LAB — BASIC METABOLIC PANEL
ANION GAP: 8 (ref 5–15)
BUN: 33 mg/dL — ABNORMAL HIGH (ref 6–20)
CHLORIDE: 100 mmol/L — AB (ref 101–111)
CO2: 31 mmol/L (ref 22–32)
Calcium: 9.1 mg/dL (ref 8.9–10.3)
Creatinine, Ser: 0.71 mg/dL (ref 0.44–1.00)
GFR calc non Af Amer: >60 mL/min (ref >60)
Glucose, Bld: 157 mg/dL — ABNORMAL HIGH (ref 65–99)
POTASSIUM: 4.4 mmol/L (ref 3.5–5.1)
SODIUM: 139 mmol/L (ref 135–145)

## 2016-04-14 MED ORDER — METHYLPREDNISOLONE SODIUM SUCC 40 MG IJ SOLR
40.0000 mg | Freq: Two times a day (BID) | INTRAMUSCULAR | Status: DC
Start: 2016-04-15 — End: 2016-04-15
  Administered 2016-04-15: 04:00:00 40 mg via INTRAVENOUS
  Filled 2016-04-14: qty 1

## 2016-04-14 MED ORDER — LEVALBUTEROL HCL 1.25 MG/0.5ML IN NEBU
1.2500 mg | INHALATION_SOLUTION | RESPIRATORY_TRACT | Status: DC
  Administered 2016-04-14 – 2016-04-16 (×12): 1.25 mg via RESPIRATORY_TRACT
  Filled 2016-04-14 (×12): qty 0.5

## 2016-04-14 MED ORDER — IPRATROPIUM-ALBUTEROL 0.5-2.5 (3) MG/3ML IN SOLN
3.0000 mL | Freq: Four times a day (QID) | RESPIRATORY_TRACT | Status: DC | PRN
  Administered 2016-04-15: 3 mL via RESPIRATORY_TRACT
  Filled 2016-04-14: qty 3

## 2016-04-14 MED ORDER — SPIRONOLACTONE 25 MG PO TABS
25.0000 mg | ORAL_TABLET | Freq: Every day | ORAL | Status: DC
  Administered 2016-04-14 – 2016-04-16 (×3): 25 mg via ORAL
  Filled 2016-04-14 (×3): qty 1

## 2016-04-14 NOTE — NC FL2 (Signed)
[place] LEVEL OF CARE SCREENING TOOL     IDENTIFICATION  Patient Name: Mariah Gross Birthdate: [birth year] Sex: female Admission Date (Current Location): 04/11/2016  [facility] [place] Number:  Engineering geologist and Address:  [facility], [address] [place], [place] [address]      Provider Number: [id number]  Attending Physician Name and Address:  Nicholes Mango, MD  Relative Name and Phone Number:       Current Level of Care: Hospital Recommended Level of Care: [place] Prior Approval Number:    Date Approved/Denied:   PASRR Number:    Discharge Plan: SNF    Current Diagnoses: Patient Active Problem List   Diagnosis Date Noted  . COPD (chronic obstructive pulmonary disease) with acute bronchitis ([place]) 04/11/2016    Orientation RESPIRATION BLADDER Height & Weight     Self, Time, Situation, Place  O2 (4 Liters Oxygen Chronic ) Continent Weight: 197 lb 6.4 oz (89.5 kg) Height:  '5\' 3"'$  (160 cm)  BEHAVIORAL SYMPTOMS/MOOD NEUROLOGICAL BOWEL NUTRITION STATUS   (none)  (none) Continent Diet (Regular Diet )  AMBULATORY STATUS COMMUNICATION OF NEEDS Skin   Extensive Assist Verbally Normal                       Personal Care Assistance Level of Assistance  Bathing, Feeding, Dressing Bathing Assistance: Limited assistance Feeding assistance: Independent Dressing Assistance: Limited assistance     Functional Limitations Info  Sight, Hearing, Speech Sight Info: Adequate Hearing Info: Adequate Speech Info: Adequate    SPECIAL CARE FACTORS FREQUENCY  PT (By licensed PT), OT (By licensed OT)     PT Frequency:  (5) OT Frequency:  (5)            Contractures      Additional Factors Info  Code Status, Allergies Code Status Info:  (Full Code. ) Allergies Info:  (Ace Inhibitors, Fluconazole, Levaquin Levofloxacin In D5w, Penicillins)           Current Medications (04/14/2016):  This is the  current hospital active medication list Current Facility-Administered Medications  Medication Dose Route Frequency Provider Last Rate Last Dose  . 0.9 %  sodium chloride infusion  250 mL Intravenous PRN Dustin Flock, MD      . acetaminophen (TYLENOL) tablet 650 mg  650 mg Oral Q6H PRN Dustin Flock, MD   650 mg at 04/12/16 0555   Or  . acetaminophen (TYLENOL) suppository 650 mg  650 mg Rectal Q6H PRN Dustin Flock, MD      . acetylcysteine (MUCOMYST) 20 % nebulizer / oral solution 4 mL  4 mL Nebulization BID Dustin Flock, MD   4 mL at 04/14/16 0751  . amLODipine (NORVASC) tablet 5 mg  5 mg Oral Daily Dustin Flock, MD   5 mg at 04/14/16 0903  . aspirin EC tablet 81 mg  81 mg Oral Daily Dustin Flock, MD   81 mg at 04/14/16 0904  . atorvastatin (LIPITOR) tablet 80 mg  80 mg Oral Daily Dustin Flock, MD   80 mg at 04/13/16 1747  . budesonide (PULMICORT) nebulizer solution 0.25 mg  0.25 mg Nebulization BID Dustin Flock, MD   0.25 mg at 04/14/16 0751  . doxycycline (VIBRA-TABS) tablet 100 mg  100 mg Oral Q12H Dustin Flock, MD   100 mg at 04/14/16 0904  . enoxaparin (LOVENOX) injection 40 mg  40 mg Subcutaneous Q24H Dustin Flock, MD   40 mg at  04/13/16 2144  . guaiFENesin-codeine 100-10 MG/5ML solution 10 mL  10 mL Oral Q4H PRN Dustin Flock, MD   10 mL at 04/13/16 1747  . levalbuterol (XOPENEX) nebulizer solution 1.25 mg  1.25 mg Nebulization Q6H Dustin Flock, MD   1.25 mg at 04/14/16 1403  . magnesium oxide (MAG-OX) tablet 400 mg  400 mg Oral Daily Dustin Flock, MD   400 mg at 04/13/16 0905  . methylPREDNISolone sodium succinate (SOLU-MEDROL) 40 mg/mL injection 40 mg  40 mg Intravenous Q6H Adalberto Metzgar, MD   40 mg at 04/14/16 1210  . metoprolol succinate (TOPROL-XL) 24 hr tablet 50 mg  50 mg Oral Daily Dustin Flock, MD   50 mg at 04/14/16 0903  . multivitamin with minerals tablet 1 tablet  1 tablet Oral Daily Merilyn Baba, RPH   1 tablet at 04/13/16 1310  . nitroGLYCERIN  (NITROSTAT) SL tablet 0.4 mg  0.4 mg Sublingual Q5 min PRN Dustin Flock, MD   0.4 mg at 04/12/16 0552  . ondansetron (ZOFRAN) tablet 4 mg  4 mg Oral Q6H PRN Dustin Flock, MD       Or  . ondansetron (ZOFRAN) injection 4 mg  4 mg Intravenous Q6H PRN Dustin Flock, MD      . phenol-menthol (CEPASTAT) lozenge 1 lozenge  1 lozenge Buccal PRN Demetrios Loll, MD   1 lozenge at 04/12/16 2053  . sertraline (ZOLOFT) tablet 25 mg  25 mg Oral Daily Dustin Flock, MD   25 mg at 04/14/16 0903  . sodium chloride flush (NS) 0.9 % injection 3 mL  3 mL Intravenous Q12H Dustin Flock, MD   3 mL at 04/14/16 1211  . sodium chloride flush (NS) 0.9 % injection 3 mL  3 mL Intravenous Q12H Dustin Flock, MD   3 mL at 04/13/16 2145  . sodium chloride flush (NS) 0.9 % injection 3 mL  3 mL Intravenous PRN Dustin Flock, MD      . spironolactone (ALDACTONE) tablet 25 mg  25 mg Oral Daily Nicholes Mango, MD   25 mg at 04/14/16 0904  . tiotropium (SPIRIVA) inhalation capsule 18 mcg  1 capsule Inhalation Daily Dustin Flock, MD   18 mcg at 04/14/16 3568     Discharge Medications: Please see discharge summary for a list of discharge medications.  Relevant Imaging Results:  Relevant Lab Results:   Additional Information  (SSN: [id number])  Sample, Veronia Beets, LCSW
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
## 2016-04-14 NOTE — Clinical Social Work Note (Addendum)
Clinical Social Work Assessment  Patient Details  Name: Mariah Gross MRN: [id number] Date of Birth: 1930-03-26  Date of referral:  04/14/16               Reason for consult:  Facility Placement                Permission sought to share information with:  Chartered certified accountant granted to share information::  Yes, Verbal Permission Granted  Name::      [place]::   [place]   Relationship::     Contact Information:     Housing/Transportation Living arrangements for the past 2 months:  [place] of Information:  Patient Patient Interpreter Needed:  None Criminal Activity/Legal Involvement Pertinent to Current Situation/Hospitalization:  No - Comment as needed Significant Relationships:  Adult Children Lives with:  Self Do you feel safe going back to the place where you live?  Yes Need for family participation in patient care:  No (Coment)  Care giving concerns:  Patient lives at [place] independent living by herself.    Social Worker assessment / plan:  Holiday representative (CSW) reviewed chart and noted that PT is recommending SNF. CSW contacted patient on her room phone today to complete assessment. Patient reported that she lives alone at [place] independent living and is on 4 liters of chronic oxygen. CSW explained that PT is recommending SNF and that [place] will have to approve SNF. Patient verbalized her understanding and is agreeable to SNF search in [place]. FL2 complete and faxed out. CSW attempted to contact patient's daughter Mariah Gross however she did not answer and a voicemail was left.   Daughter Mariah Gross called back and is in agreement with plan.   Employment status:  Disabled (Comment on whether or not currently receiving Disability), Retired Nurse, adult PT Recommendations:  [place] / Referral to community resources:  [place]  Patient/Family's Response to care:  Patient is agreeable to [facility] in [place].   Patient/Family's Understanding of and Emotional Response to Diagnosis, Current Treatment, and Prognosis:  Patient was pleasant and thanked CSW for calling.   Emotional Assessment Appearance:  Appears stated age Attitude/Demeanor/Rapport:    Affect (typically observed):  Accepting, Adaptable, Pleasant Orientation:  Oriented to Self, Oriented to Place, Oriented to  Time, Oriented to Situation Alcohol / Substance use:  Not Applicable Psych involvement (Current and /or in the community):  No (Comment)  Discharge Needs  Concerns to be addressed:  Discharge Planning Concerns Readmission within the last 30 days:  No Current discharge risk:  Dependent with Mobility, Chronically ill Barriers to Discharge:  Continued Medical Work up   [employer], Veronia Beets, LCSW 04/14/2016, 3:53 PM

## 2016-04-14 NOTE — Progress Notes (Signed)
[facility] [place] NAME: Mariah Gross    MR#:  [id number]  DATE OF BIRTH:  09-Aug-1929  SUBJECTIVE:  CHIEF COMPLAINT:  Patient is feeling  Better, cough is improving  REVIEW OF SYSTEMS:  CONSTITUTIONAL: No fever, fatigue or weakness.  EYES: No blurred or double vision.  EARS, NOSE, AND THROAT: No tinnitus or ear pain.  RESPIRATORY: Reporting cough, improving shortness of breath and  wheezing. Denies any hemoptysis  CARDIOVASCULAR: No chest pain, orthopnea, edema.  GASTROINTESTINAL: No nausea, vomiting, diarrhea or abdominal pain.  GENITOURINARY: No dysuria, hematuria.  ENDOCRINE: No polyuria, nocturia,  HEMATOLOGY: No anemia, easy bruising or bleeding SKIN: No rash or lesion. MUSCULOSKELETAL: No joint pain or arthritis.   NEUROLOGIC: No tingling, numbness, weakness.  PSYCHIATRY: No anxiety or depression.   DRUG ALLERGIES:   Allergies  Allergen Reactions  . Ace Inhibitors   . Fluconazole   . Levaquin [Levofloxacin In D5w]   . Penicillins     VITALS:  Blood pressure (!) 158/68, pulse 79, temperature 98.1 F (36.7 C), temperature source Oral, resp. rate 20, height '5\' 3"'$  (1.6 m), weight 89.5 kg (197 lb 6.4 oz), SpO2 93 %.  PHYSICAL EXAMINATION:  GENERAL:  83 y.o.-year-old patient lying in the bed with no acute distress.  EYES: Pupils equal, round, reactive to light and accommodation. No scleral icterus. Extraocular muscles intact.  HEENT: Head atraumatic, normocephalic. Oropharynx and nasopharynx clear.  NECK:  Supple, no jugular venous distention. No thyroid enlargement, no tenderness.  LUNGS: Mod breath sounds bilaterally with no wheezing and  min bronchial breath sounds No use of accessory muscles of respiration.  CARDIOVASCULAR: S1, S2 normal. No murmurs, rubs, or gallops.  ABDOMEN: Soft, nontender, nondistended. Bowel sounds present. No organomegaly or mass.  EXTREMITIES: No pedal edema, cyanosis, or clubbing.   NEUROLOGIC: Cranial nerves II through XII are intact. Muscle strength 5/5 in all extremities. Sensation intact. Gait not checked.  PSYCHIATRIC: The patient is alert and oriented x 3.  SKIN: No obvious rash, lesion, or ulcer.    LABORATORY PANEL:   CBC  Recent Labs Lab 04/12/16 0446  WBC 16.5*  HGB 12.5  HCT 37.2  PLT 165   ------------------------------------------------------------------------------------------------------------------  Chemistries   Recent Labs Lab 04/11/16 0748  04/14/16 0548  NA 143  < > 139  K 4.3  < > 4.4  CL 104  < > 100*  CO2 31  < > 31  GLUCOSE 93  < > 157*  BUN 32*  < > 33*  CREATININE 1.00  < > 0.71  CALCIUM 9.8  < > 9.1  AST 30  --   --   ALT 20  --   --   ALKPHOS 70  --   --   BILITOT 0.6  --   --   < > = values in this interval not displayed. ------------------------------------------------------------------------------------------------------------------  Cardiac Enzymes  Recent Labs Lab 04/12/16 1942  TROPONINI 0.03*   ------------------------------------------------------------------------------------------------------------------  RADIOLOGY:  Dg Chest Port 1 View  Result Date: 04/13/2016 CLINICAL DATA:  83 year old female with shortness of breath. COPD exacerbation. Subsequent encounter. EXAM: PORTABLE CHEST 1 VIEW COMPARISON:  04/11/2016. FINDINGS: Mild cardiomegaly.  Coronary artery calcifications. Calcified slightly tortuous aorta. Pulmonary vascular prominence most notable centrally. Limited evaluation of retrocardiac region otherwise no segmental consolidation noted. No plain film evidence of pulmonary malignancy. Blunting right costophrenic angle stable. Question small pleural effusion versus scarring. No remote exams available to determine if this represents  a change. Scoliosis thoracic spine convex right. IMPRESSION: Overall similar appearance to 04/11/2016 exam as noted above. Electronically Signed   By: Genia Del  M.D.   On: 04/13/2016 08:16    EKG:   Orders placed or performed during the hospital encounter of 04/11/16  . ED EKG  . ED EKG  . EKG 12-Lead  . EKG 12-Lead  . EKG 12-Lead  . EKG 12-Lead    ASSESSMENT AND PLAN:   Patient is a 83 year old with COPD chronic respiratory failure presenting with shortness of breath  1. Acute on chronic respiratory failure due to acute on chronic COPD exasperation as well as acute bronchitis Clinically improving We'll taperIV Solu-Medrol t  Continue nebulizer therapy and Mucomyst Patient states she is intolerant of Mucinex makes her nauseous Continue on doxycycline for acute bronchitis she was treated with azithromycin with no significant improvement IS pulm consult for establishment of care  2. Orthopnea and lower extremity swelling Echo - 45 -50 % EF Continue therapy with sprinolactone if rpt POt is in the nml range Encouraged patient to keep  legs elevated   3. Essential hypertension Continue therapy with amlodipine and metoprolol  4. Sinus tachycardia monitor heart rate I will have to use Xopenex instead of albuterol Continue metoprolol   5. Depression continue sertraline  6. Hyperlipidemia unspecified continue atorvastatin  7. Miscellaneous Lovenox for DVT prophylaxis   PT consult -snf  All the records are reviewed and case discussed with Care Management/Social Workerr. Management plans discussed with the patient, family and they are in agreement.  CODE STATUS: fc   TOTAL TIME TAKING CARE OF THIS PATIENT: 35  minutes.   POSSIBLE D/C IN 1-2 DAYS, DEPENDING ON CLINICAL CONDITION.  Note: This dictation was prepared with Dragon dictation along with smaller phrase technology. Any transcriptional errors that result from this process are unintentional.   Nicholes Mango M.D on 04/14/2016 at 8:29 PM  Between 7am to 6pm - Pager - [phone] After 6pm go to [email or web address] - password [email or web address] [facility]   Office  [phone]  CC: Primary care physician; Marcelina Morel, MD

## 2016-04-14 NOTE — Clinical Social Work Placement (Signed)
   CLINICAL SOCIAL WORK PLACEMENT  NOTE  Date:  04/14/2016  Patient Details  Name: Mariah Gross MRN: [id number] Date of Birth: 1929/06/02  Clinical Social Work is seeking post-discharge placement for this patient at the [place] level of care (*CSW will initial, date and re-position this form in  chart as items are completed):  Yes   Patient/family provided with [place] Work Department's list of facilities offering this level of care within the geographic area requested by the patient (or if unable, by the patient's family).  Yes   Patient/family informed of their freedom to choose among providers that offer the needed level of care, that participate in Medicare, Medicaid or managed care program needed by the patient, have an available bed and are willing to accept the patient.  Yes   Patient/family informed of [facility] ownership interest in [facility] and [facility], as well as of the fact that they are under no obligation to receive care at these facilities.  PASRR submitted to EDS on  (PASARR could not be submitted and an existing PASARR could not be confirmed because PASARR is closed today. )     PASRR number received on       Existing PASRR number confirmed on       FL2 transmitted to all facilities in geographic area requested by pt/family on 04/14/16     FL2 transmitted to all facilities within larger geographic area on       Patient informed that his/her managed care company has contracts with or will negotiate with certain facilities, including the following:            Patient/family informed of bed offers received.  Patient chooses bed at       Physician recommends and patient chooses bed at      Patient to be transferred to   on  .  Patient to be transferred to facility by       Patient family notified on   of transfer.  Name of family member notified:        PHYSICIAN Please sign FL2     Additional Comment:     _______________________________________________ Shekera Beavers, Veronia Beets, LCSW 04/14/2016, 3:52 PM

## 2016-04-14 NOTE — Plan of Care (Signed)
Problem: Phase I Progression Outcomes Goal: Progress activity as tolerated unless otherwise ordered Outcome: Progressing Very dyspneic on exertion but able to ambulate to [facility] on her baseline 02 while maintaining saturation.

## 2016-04-14 NOTE — Evaluation (Addendum)
Physical Therapy Evaluation Patient Details Name: Mariah Gross MRN: [id number] DOB: 12/20/1929 Today's Date: 04/14/2016   History of Present Illness  Mariah Gross  is a 83 y.o. female with a known history of COPD on chronic oxygen therapy ranging from 3-4 L based on activity, coronary artery disease, hypercholesterolemia, hyperparathyroidism, essential hypertension, history lungs cancer, obstructive sleep apnea  intolerant with CPAP was coming to the hospital with complaining of shortness of breath ongoing for the past 3-4 days. Patient has had progressive shortness of breath at rest and activity. She also describes a rattling sound in her lungs. She has had some cough which is yellow in color. She also complains of swelling of her lower extremity. Denies any nausea vomiting or diarrhea. She complains of chest pain with coughing  Clinical Impression  Pt admitted with above diagnosis. Pt currently with functional limitations due to the deficits listed below (see PT Problem List).  Pt becomes exceedingly weak and fatigued with ambulation. SaO2 drops on 4L/min and pt with severe DOE. This is a considerable decline from her baseline per subjective report. Pt is currently unsafe to return to her apartment due to compromised cardiopulmonary status with progressive fatigue/weakness during ambulation. Will continue to monitor and if breathing status improves may be able to upgrade discharge recommendations. Pt will benefit from skilled PT services to address deficits in strength, balance, and mobility in order to return to full function at home.     Follow Up Recommendations SNF    Equipment Recommendations  None recommended by PT    Recommendations for Other Services       Precautions / Restrictions Precautions Precautions: Fall Restrictions Weight Bearing Restrictions: No      Mobility  Bed Mobility               General bed mobility comments: Received upright at EOB and left upright at  EOB  Transfers Overall transfer level: Needs assistance Equipment used: Rolling walker (2 wheeled) Transfers: Sit to/from Stand Sit to Stand: Min guard         General transfer comment: Pt demonstrates safe hand placement and fair speed/sequencing during transfer. Once upright is steady and stable in standing. Performed transfers to/from commode and pt demonstrates good hand placement and safe sequencing with cues  Ambulation/Gait Ambulation/Gait assistance: Min guard Ambulation Distance (Feet): 60 Feet Assistive device: Rolling walker (2 wheeled) Gait Pattern/deviations: Step-through pattern;Decreased step length - right;Decreased step length - left Gait velocity: Decreased Gait velocity interpretation: Below normal speed for age/gender General Gait Details: Pt able to ambulate to RN station and back to bed with rolling walker and 4L/min supplemental O2. SaO2 drops to 84% during ambulation and pt reports DOE. Pt requires 60-90 seconds of pursed lip breathing on 4L/min O2 for SaO2 to recover to 92%. Pt demonstrates good safety awareness and stability with ambulation. Continous monitoring of vitals and fatigue  Stairs            Wheelchair Mobility    Modified Rankin (Stroke Patients Only)       Balance Overall balance assessment: Needs assistance Sitting-balance support: No upper extremity supported Sitting balance-Leahy Scale: Good     Standing balance support: No upper extremity supported Standing balance-Leahy Scale: Fair Standing balance comment: Fair balance in wide stance                             Pertinent Vitals/Pain Pain Assessment: No/denies pain    Home  Living Family/patient expects to be discharged to:: Other (Comment) (Independent living apartment at [facility]) Living Arrangements: Alone Available Help at Discharge: Family Type of Home: Apartment Home Access: Elevator;Other (comment) (On 5th floor)     Home Layout: One level Home  Equipment: [place] - 4 wheels;Cane - single point;Grab bars - tub/shower;Shower seat - built in;Other (comment) (Home O2, 4L/min with activity, 3L/min at rest)      Prior Function Level of Independence: Independent with assistive device(s)         Comments: Ambulates with rollator and 4L/min O2 with activity. No falls but reports some "stumbles" in the last 12 months. Independent with ADLs/IADLs     Hand Dominance   Dominant Hand: Right    Extremity/Trunk Assessment   Upper Extremity Assessment Upper Extremity Assessment: Overall WFL for tasks assessed    Lower Extremity Assessment Lower Extremity Assessment: Overall WFL for tasks assessed       Communication   Communication: No difficulties  Cognition Arousal/Alertness: Awake/alert Behavior During Therapy: WFL for tasks assessed/performed Overall Cognitive Status: Within Functional Limits for tasks assessed                      General Comments      Exercises     Assessment/Plan    PT Assessment Patient needs continued PT services  PT Problem List Decreased strength;Decreased activity tolerance;Decreased balance;Cardiopulmonary status limiting activity          PT Treatment Interventions DME instruction;Gait training;Functional mobility training;Therapeutic activities;Therapeutic exercise;Balance training;Neuromuscular re-education;Patient/family education    PT Goals (Current goals can be found in the Care Plan section)  Acute Rehab PT Goals Patient Stated Goal: Return to prior function at her independent living apartment PT Goal Formulation: With patient Time For Goal Achievement: 04/28/16 Potential to Achieve Goals: Good    Frequency Min 2X/week   Barriers to discharge Decreased caregiver support Lives alone    Co-evaluation               End of Session Equipment Utilized During Treatment: Gait belt;Oxygen;Other (comment) (4L/min O2) Activity Tolerance: Patient limited by  fatigue Patient left: in bed;with call bell/phone within reach;with bed alarm set Nurse Communication: Mobility status         Time: 1250-1315 PT Time Calculation (min) (ACUTE ONLY): 25 min   Charges:   PT Evaluation $PT Eval Low Complexity: 1 Procedure PT Treatments $Gait Training: 8-22 mins   PT G Codes:       Lyndel Safe Betty Brooks PT, DPT   Clayson Riling 04/14/2016, 1:26 PM

## 2016-04-15 ENCOUNTER — Inpatient Hospital Stay: Payer: MEDICARE

## 2016-04-15 DIAGNOSIS — J441 Chronic obstructive pulmonary disease with (acute) exacerbation: Secondary | ICD-10-CM | POA: Diagnosis not present

## 2016-04-15 DIAGNOSIS — R0602 Shortness of breath: Secondary | ICD-10-CM | POA: Diagnosis not present

## 2016-04-15 LAB — BASIC METABOLIC PANEL
Anion gap: 6 (ref 5–15)
BUN: 38 mg/dL — AB (ref 6–20)
CHLORIDE: 103 mmol/L (ref 101–111)
CO2: 32 mmol/L (ref 22–32)
CREATININE: 0.89 mg/dL (ref 0.44–1.00)
Calcium: 9 mg/dL (ref 8.9–10.3)
GFR calc Af Amer: >60 mL/min (ref >60)
GFR calc non Af Amer: 58 mL/min — ABNORMAL LOW (ref >60)
GLUCOSE: 146 mg/dL — AB (ref 65–99)
Potassium: 4 mmol/L (ref 3.5–5.1)
Sodium: 141 mmol/L (ref 135–145)

## 2016-04-15 MED ORDER — METHYLPREDNISOLONE SODIUM SUCC 40 MG IJ SOLR
40.0000 mg | Freq: Three times a day (TID) | INTRAMUSCULAR | Status: DC
  Administered 2016-04-15 – 2016-04-16 (×4): 40 mg via INTRAVENOUS
  Filled 2016-04-15 (×4): qty 1

## 2016-04-15 NOTE — Progress Notes (Addendum)
[facility] admissions coordinator at [facility] extended a bed offer and patient accepted bed offer. Per Magda Paganini she will start [place] authorization today.   P[facility] could not be submitted or looked up on [place] [facility] website. CSW contacted [facility] and their office is closed today.   [employer], LCSW [phone]

## 2016-04-15 NOTE — Plan of Care (Signed)
Problem: Phase I Progression Outcomes Goal: Dyspnea controlled at rest Outcome: Not Progressing Pt still dyspneic at rest and exertion. Refused to wear cpap during the night. Chronic 02 at 3 but currently at 4 liters. Still on IV steroids.

## 2016-04-15 NOTE — Progress Notes (Signed)
[facility] [place] NAME: Mariah Gross    MR#:  [id number]  DATE OF BIRTH:  Sep 07, 1929  SUBJECTIVE:  CHIEF COMPLAINT:  Patient is Wheezing and feeling tight in her chest  REVIEW OF SYSTEMS:  CONSTITUTIONAL: No fever, fatigue or weakness.  EYES: No blurred or double vision.  EARS, NOSE, AND THROAT: No tinnitus or ear pain.  RESPIRATORY: Reporting cough, improving shortness of breath and  wheezing. Denies any hemoptysis  CARDIOVASCULAR: No chest pain, orthopnea, edema.  GASTROINTESTINAL: No nausea, vomiting, diarrhea or abdominal pain.  GENITOURINARY: No dysuria, hematuria.  ENDOCRINE: No polyuria, nocturia,  HEMATOLOGY: No anemia, easy bruising or bleeding SKIN: No rash or lesion. MUSCULOSKELETAL: No joint pain or arthritis.   NEUROLOGIC: No tingling, numbness, weakness.  PSYCHIATRY: No anxiety or depression.   DRUG ALLERGIES:   Allergies  Allergen Reactions  . Ace Inhibitors   . Fluconazole   . Levaquin [Levofloxacin In D5w]   . Penicillins     VITALS:  Blood pressure (!) 177/78, pulse 76, temperature 97.7 F (36.5 C), temperature source Oral, resp. rate 20, height '5\' 3"'$  (1.6 m), weight 89.5 kg (197 lb 6.4 oz), SpO2 97 %.  PHYSICAL EXAMINATION:  GENERAL:  83 y.o.-year-old patient lying in the bed with no acute distress.  EYES: Pupils equal, round, reactive to light and accommodation. No scleral icterus. Extraocular muscles intact.  HEENT: Head atraumatic, normocephalic. Oropharynx and nasopharynx clear.  NECK:  Supple, no jugular venous distention. No thyroid enlargement, no tenderness.  LUNGS: Mod breath sounds bilaterally with no wheezing and  min bronchial breath sounds No use of accessory muscles of respiration.  CARDIOVASCULAR: S1, S2 normal. No murmurs, rubs, or gallops.  ABDOMEN: Soft, nontender, nondistended. Bowel sounds present. No organomegaly or mass.  EXTREMITIES: No pedal edema, cyanosis, or clubbing.   NEUROLOGIC: Cranial nerves II through XII are intact. Muscle strength 5/5 in all extremities. Sensation intact. Gait not checked.  PSYCHIATRIC: The patient is alert and oriented x 3.  SKIN: No obvious rash, lesion, or ulcer.    LABORATORY PANEL:   CBC  Recent Labs Lab 04/12/16 0446  WBC 16.5*  HGB 12.5  HCT 37.2  PLT 165   ------------------------------------------------------------------------------------------------------------------  Chemistries   Recent Labs Lab 04/11/16 0748  04/15/16 0506  NA 143  < > 141  K 4.3  < > 4.0  CL 104  < > 103  CO2 31  < > 32  GLUCOSE 93  < > 146*  BUN 32*  < > 38*  CREATININE 1.00  < > 0.89  CALCIUM 9.8  < > 9.0  AST 30  --   --   ALT 20  --   --   ALKPHOS 70  --   --   BILITOT 0.6  --   --   < > = values in this interval not displayed. ------------------------------------------------------------------------------------------------------------------  Cardiac Enzymes  Recent Labs Lab 04/12/16 1942  TROPONINI 0.03*   ------------------------------------------------------------------------------------------------------------------  RADIOLOGY:  Dg Chest Port 1 View  Result Date: 04/15/2016 CLINICAL DATA:  Acute respiratory failure EXAM: PORTABLE CHEST 1 VIEW COMPARISON:  Two days ago FINDINGS: Stable cardiopericardial enlargement and aortic tortuosity. Clips noted at distorted right hilum, history of partial lobectomy. Ipsilateral cardiophrenic sulcus blunting is likely scarring and is stable. No generalized Kerley lines or air bronchogram. No pneumothorax. Scoliosis. Coronary stenting. IMPRESSION: Cardiomegaly and vascular congestion. No convincing change from 2 days ago Electronically Signed   By: Angelica Chessman  Watts M.D.   On: 04/15/2016 07:08    EKG:   Orders placed or performed during the hospital encounter of 04/11/16  . ED EKG  . ED EKG  . EKG 12-Lead  . EKG 12-Lead  . EKG 12-Lead  . EKG 12-Lead    ASSESSMENT AND  PLAN:   Patient is a 83 year old with COPD chronic respiratory failure presenting with shortness of breath  1. Acute on chronic respiratory failure due to acute on chronic COPD exasperation as well as acute bronchitis Feeling tight in her chest We'll continue I V Solu-Medrol  Get chest x-ray  Continue nebulizer therapy and Mucomyst Patient states she is intolerant of Mucinex makes her nauseous Continue on doxycycline for acute bronchitis she was treated with azithromycin with no significant improvement IS Ambulate patient pulm consult for establishment of care  2. Orthopnea and lower extremity swelling Echo - 45 -50 % EF Continue therapy with sprinolactone if rpt POt is in the nml range Encouraged patient to keep  legs elevated   3. Essential hypertension Continue therapy with amlodipine and metoprolol  4. Sinus tachycardia monitor heart rate I will have to use Xopenex instead of albuterol Continue metoprolol   5. Depression continue sertraline  6. Hyperlipidemia unspecified continue atorvastatin  7. Miscellaneous Lovenox for DVT prophylaxis   PT consult -snf, PENDING PASS[facility]  All the records are reviewed and case discussed with Care Management/Social Workerr. Management plans discussed with the patient, family and they are in agreement.  CODE STATUS: fc   TOTAL TIME TAKING CARE OF THIS PATIENT: 35  minutes.   POSSIBLE D/C IN 1-2 DAYS, DEPENDING ON CLINICAL CONDITION.  Note: This dictation was prepared with Dragon dictation along with smaller phrase technology. Any transcriptional errors that result from this process are unintentional.   Nicholes Mango M.D on 04/15/2016 at 5:48 PM  Between 7am to 6pm - Pager - [phone] After 6pm go to [email or web address] - password [email or web address]  Office  [phone]  CC: Primary care physician; Marcelina Morel, MD

## 2016-04-15 NOTE — Progress Notes (Signed)
cpap declined

## 2016-04-15 NOTE — Care Management Obs Status (Signed)
[facility] NOTIFICATION   Patient Details  Name: Mariah Gross MRN: [id number] Date of Birth: [birth year]   Medicare Observation Status Notification Given:  Yes    Shelbie Ammons, RN 04/15/2016, 9:55 AM

## 2016-04-16 DIAGNOSIS — R0602 Shortness of breath: Secondary | ICD-10-CM | POA: Diagnosis not present

## 2016-04-16 DIAGNOSIS — J441 Chronic obstructive pulmonary disease with (acute) exacerbation: Secondary | ICD-10-CM | POA: Diagnosis not present

## 2016-04-16 LAB — BASIC METABOLIC PANEL
ANION GAP: 10 (ref 5–15)
BUN: 37 mg/dL — AB (ref 6–20)
CO2: 31 mmol/L (ref 22–32)
Calcium: 9.1 mg/dL (ref 8.9–10.3)
Chloride: 99 mmol/L — ABNORMAL LOW (ref 101–111)
Creatinine, Ser: 0.95 mg/dL (ref 0.44–1.00)
GFR calc Af Amer: >60 mL/min (ref >60)
GFR calc non Af Amer: 53 mL/min — ABNORMAL LOW (ref >60)
GLUCOSE: 197 mg/dL — AB (ref 65–99)
POTASSIUM: 4.4 mmol/L (ref 3.5–5.1)
Sodium: 140 mmol/L (ref 135–145)

## 2016-04-16 LAB — CBC
HEMATOCRIT: 38.4 % (ref 35.0–47.0)
Hemoglobin: 13.1 g/dL (ref 12.0–16.0)
MCH: 30.3 pg (ref 26.0–34.0)
MCHC: 34.2 g/dL (ref 32.0–36.0)
MCV: 88.6 fL (ref 80.0–100.0)
PLATELETS: 183 10*3/uL (ref 150–440)
RBC: 4.33 MIL/uL (ref 3.80–5.20)
RDW: 15.7 % — ABNORMAL HIGH (ref 11.5–14.5)
WBC: 8 10*3/uL (ref 3.6–11.0)

## 2016-04-16 MED ORDER — LEVALBUTEROL HCL 1.25 MG/0.5ML IN NEBU: 1.2500 mg | INHALATION_SOLUTION | Freq: Four times a day (QID) | RESPIRATORY_TRACT | 12 refills | Status: DC | PRN

## 2016-04-16 MED ORDER — GUAIFENESIN-CODEINE 100-10 MG/5ML PO SOLN: 10.0000 mL | Freq: Four times a day (QID) | ORAL | 0 refills | Status: AC | PRN

## 2016-04-16 MED ORDER — ACETAMINOPHEN 325 MG PO TABS: 650.0000 mg | ORAL_TABLET | Freq: Four times a day (QID) | ORAL | Status: AC | PRN

## 2016-04-16 MED ORDER — DOXYCYCLINE HYCLATE 100 MG PO TABS: 100.0000 mg | ORAL_TABLET | Freq: Two times a day (BID) | ORAL | 0 refills | Status: AC

## 2016-04-16 MED ORDER — CEPASTAT 14.5 MG MT LOZG: 1.0000 | LOZENGE | OROMUCOSAL | 0 refills | Status: AC | PRN

## 2016-04-16 MED ORDER — PREDNISONE 10 MG (21) PO TBPK: 10.0000 mg | ORAL_TABLET | Freq: Every day | ORAL | 0 refills | Status: AC

## 2016-04-16 NOTE — Progress Notes (Addendum)
P[facility] could not be submitted or looked up on [place] [employer] website. CSW contacted [facility] and their office is closed today. Patient can't discharge until PAS[facility] can be verified, which will likely be Monday 04/19/16.   Per [facility] admissions coordinator at [facility] authorization has been received for SNF.   [facility] office opened on a delay today and [facility] number was verified. Patient can D/C to [facility] when stable.   [employer], LCSW [phone]

## 2016-04-16 NOTE — Care Management Important Message (Signed)
Important Message  Patient Details  Name: Mariah Gross MRN: [id number] Date of Birth: Aug 11, 1929   Medicare Important Message Given:  Yes Copy of signed IM left delivered to patient.    Marshell Garfinkel, RN 04/16/2016, 8:18 AM

## 2016-04-16 NOTE — Discharge Summary (Signed)
[facility] [place] NAME: Mariah Gross    MR#:  [id number]  DATE OF BIRTH:  02/10/1930  DATE OF ADMISSION:  04/11/2016 ADMITTING PHYSICIAN: Dustin Flock, MD  DATE OF DISCHARGE: 04/16/16 PRIMARY CARE PHYSICIAN: Marcelina Morel, MD    ADMISSION DIAGNOSIS:  COPD exacerbation ([place]) [J44.1]  DISCHARGE DIAGNOSIS:  Active Problems:   COPD (chronic obstructive pulmonary disease) with acute bronchitis ([place])   SECONDARY DIAGNOSIS:   Past Medical History:  Diagnosis Date  . COPD (chronic obstructive pulmonary disease) ([place])   . Coronary artery disease   . Hypercholesteremia   . Hyperparathyroidism ([place])   . Hypertension   . Lung cancer ([place])   . OSA (obstructive sleep apnea)   . Osteopenia   . Renal artery stenosis ([place])   . Renal disorder   . Retinal artery occlusion     HOSPITAL COURSE:   HISTORY OF PRESENT ILLNESS: Mariah Gross  is a 83 y.o. female with a known history of COPD on chronic oxygen therapy ranging from 3-4 L based on activity, coronary artery disease, hypercholesterolemia, hyperparathyroidism, essential hypertension, history lungs cancer, obstructive sleep apnea  intolerant with CPAP was coming to the hospital with complaining of shortness of breath ongoing for the past 3-4 days. Patient has had progressive shortness of breath at rest and activity. She also describes a rattling sound in her lungs. She has had some cough which is yellow in color. She also complains of swelling of her lower extremity. Denies any nausea vomiting or diarrhea. She complains of chest pain with coughing  Hospital course   1. Acute on chronic respiratory failure due to acute on chronic COPD exasperation as well as acute bronchitis Feeling better,Ambulating in the hallway with physical therapy Improved with the IV steroids. Taper her to by mouth prednisone   Continue nebulizer therapy and Mucomyst Patient states she is intolerant of  Mucinex makes her nauseous Continue on doxycycline for acute bronchitis  IS Ambulate patient pulm consult for establishment of care, op f/u   2. Orthopnea and lower extremity swelling Echo - 45 -50 % EF Continue therapy with sprinolactone  Encouraged patient to keep  legs elevated   3. Essential hypertension Continue therapy with amlodipine and metoprolol  4. Sinus tachycardia monitor heart rate I will have to use Xopenex instead of albuterol Continue metoprolol   5. Depression continue sertraline  6. Hyperlipidemia unspecified continue atorvastatin  7. Miscellaneous Lovenox for DVT prophylaxis   PT consult -snf, Patient will be discharged to [facility] facility today on oxygen via nasal cannula.  Plan of care discussed with the patient and her daughter both are agreeable AND all questions are answered to their satisfaction    DISCHARGE CONDITIONS:   stable  CONSULTS OBTAINED:     PROCEDURES none  DRUG ALLERGIES:   Allergies  Allergen Reactions  . Ace Inhibitors   . Fluconazole   . Levaquin [Levofloxacin In D5w]   . Penicillins     DISCHARGE MEDICATIONS:   Current Discharge Medication List    START taking these medications   Details  acetaminophen (TYLENOL) 325 MG tablet Take 2 tablets (650 mg total) by mouth every 6 (six) hours as needed for mild pain (or Fever >/= 101).    doxycycline (VIBRA-TABS) 100 MG tablet Take 1 tablet (100 mg total) by mouth every 12 (twelve) hours. Qty: 8 tablet, Refills: 0    guaiFENesin-codeine 100-10 MG/5ML syrup Take 10 mLs by mouth  every 6 (six) hours as needed for cough. Qty: 120 mL, Refills: 0    phenol-menthol (CEPASTAT) 14.5 MG lozenge Place 1 lozenge inside cheek as needed for sore throat. Qty: 100 tablet, Refills: 0    predniSONE (STERAPRED UNI-PAK 21 TAB) 10 MG (21) TBPK tablet Take 1 tablet (10 mg total) by mouth daily. Take 6 tablets by mouth for 1 day followed by  5 tablets  by mouth for 1 day followed by  4 tablets by mouth for 1 day followed by  3 tablets by mouth for 1 day followed by  2 tablets by mouth for 1 day followed by  1 tablet by mouth for a day and stop Qty: 21 tablet, Refills: 0      CONTINUE these medications which have NOT CHANGED   Details  albuterol (PROVENTIL) (2.5 MG/3ML) 0.083% nebulizer solution Inhale 3 mLs into the lungs every 6 (six) hours as needed.    amLODipine (NORVASC) 5 MG tablet Take 5 mg by mouth daily.    aspirin EC 81 MG tablet Take 81 mg by mouth daily.    atorvastatin (LIPITOR) 80 MG tablet Take 80 mg by mouth daily.    Fluticasone-Salmeterol (ADVAIR DISKUS) 250-50 MCG/DOSE AEPB Inhale 2 puffs into the lungs every 12 (twelve) hours.    magnesium oxide (MAG-OX) 400 MG tablet Take 400 mg by mouth daily.    metoprolol succinate (TOPROL-XL) 50 MG 24 hr tablet Take 50 mg by mouth daily.    Multiple Vitamin (MULTI-VITAMINS) TABS Take 1 tablet by mouth daily.    nitroGLYCERIN (NITROSTAT) 0.4 MG SL tablet Place 0.4 mg under the tongue every 5 (five) minutes x 3 doses as needed.    sertraline (ZOLOFT) 50 MG tablet Take 25 mg by mouth daily.     spironolactone (ALDACTONE) 25 MG tablet Take 25 mg by mouth daily.    tiotropium (SPIRIVA) 18 MCG inhalation capsule Place 1 capsule into inhaler and inhale daily.      STOP taking these medications     azithromycin (ZITHROMAX) 250 MG tablet      predniSONE (DELTASONE) 10 MG tablet          DISCHARGE INSTRUCTIONS:   Continue oxygen via nasal cannula Outpatient follow-up with the primary care physician at the facility in 2-3 days Outpatient follow-up with pulmonology Dr. Fabio Bering in a week  DIET:  LOW SALT  DISCHARGE CONDITION:  Fair  ACTIVITY:  Activity as tolerated  OXYGEN:  Home Oxygen: Yes.     Oxygen Delivery: 4-5 liters/min via Patient connected to nasal cannula oxygen  DISCHARGE LOCATION:  nursing home   If you experience worsening of your  admission symptoms, develop shortness of breath, life threatening emergency, suicidal or homicidal thoughts you must seek medical attention immediately by calling 911 or calling your MD immediately  if symptoms less severe.  You Must read complete instructions/literature along with all the possible adverse reactions/side effects for all the Medicines you take and that have been prescribed to you. Take any new Medicines after you have completely understood and accpet all the possible adverse reactions/side effects.   Please note  You were cared for by a hospitalist during your hospital stay. If you have any questions about your discharge medications or the care you received while you were in the hospital after you are discharged, you can call the unit and asked to speak with the hospitalist on call if the hospitalist that took care of you is not available. Once you are  discharged, your primary care physician will handle any further medical issues. Please note that NO REFILLS for any discharge medications will be authorized once you are discharged, as it is imperative that you return to your primary care physician (or establish a relationship with a primary care physician if you do not have one) for your aftercare needs so that they can reassess your need for medications and monitor your lab values.     Today  Chief Complaint  Patient presents with  . Shortness of Breath   Patient's shortness of breath and tightness in the chest significantly improved. Cough is better  ROS:  CONSTITUTIONAL: Denies fevers, chills. Denies any fatigue, weakness.  EYES: Denies blurry vision, double vision, eye pain. EARS, NOSE, THROAT: Denies tinnitus, ear pain, hearing loss. RESPIRATORY: Improving cough, denies wheeze, shortness of breath.  CARDIOVASCULAR: Denies chest pain, palpitations, edema.  GASTROINTESTINAL: Denies nausea, vomiting, diarrhea, abdominal pain. Denies bright red blood per  rectum. GENITOURINARY: Denies dysuria, hematuria. ENDOCRINE: Denies nocturia or thyroid problems. HEMATOLOGIC AND LYMPHATIC: Denies easy bruising or bleeding. SKIN: Denies rash or lesion. MUSCULOSKELETAL: Denies pain in neck, back, shoulder, knees, hips or arthritic symptoms.  NEUROLOGIC: Denies paralysis, paresthesias.  PSYCHIATRIC: Denies anxiety or depressive symptoms.   VITAL SIGNS:  Blood pressure (!) 159/77, pulse 80, temperature 97.6 F (36.4 C), temperature source Oral, resp. rate 20, height '5\' 3"'$  (1.6 m), weight 89.5 kg (197 lb 6.4 oz), SpO2 97 %.  I/O:    Intake/Output Summary (Last 24 hours) at 04/16/16 1423 Last data filed at 04/16/16 1300  Gross per 24 hour  Intake              720 ml  Output                0 ml  Net              720 ml    PHYSICAL EXAMINATION:  GENERAL:  83 y.o.-year-old patient lying in the bed with no acute distress.  EYES: Pupils equal, round, reactive to light and accommodation. No scleral icterus. Extraocular muscles intact.  HEENT: Head atraumatic, normocephalic. Oropharynx and nasopharynx clear.  NECK:  Supple, no jugular venous distention. No thyroid enlargement, no tenderness.  LUNGS: Moderate breath sounds bilaterally, no wheezing, rales,rhonchi or crepitation. No use of accessory muscles of respiration.  CARDIOVASCULAR: S1, S2 normal. No murmurs, rubs, or gallops.  ABDOMEN: Soft, non-tender, non-distended. Bowel sounds present. No organomegaly or mass.  EXTREMITIES: No pedal edema, cyanosis, or clubbing.  NEUROLOGIC: Cranial nerves II through XII are intact. Muscle strength 5/5 in all extremities. Sensation intact. Gait not checked.  PSYCHIATRIC: The patient is alert and oriented x 3.  SKIN: No obvious rash, lesion, or ulcer.   DATA REVIEW:   CBC  Recent Labs Lab 04/16/16 0453  WBC 8.0  HGB 13.1  HCT 38.4  PLT 183    Chemistries   Recent Labs Lab 04/11/16 0748  04/16/16 0453  NA 143  < > 140  K 4.3  < > 4.4  CL 104   < > 99*  CO2 31  < > 31  GLUCOSE 93  < > 197*  BUN 32*  < > 37*  CREATININE 1.00  < > 0.95  CALCIUM 9.8  < > 9.1  AST 30  --   --   ALT 20  --   --   ALKPHOS 70  --   --   BILITOT 0.6  --   --   < > =  values in this interval not displayed.  Cardiac Enzymes  Recent Labs Lab 04/12/16 1942  TROPONINI 0.03*    Microbiology Results  No results found for this or any previous visit.  RADIOLOGY:  Dg Chest Port 1 View  Result Date: 04/15/2016 CLINICAL DATA:  Acute respiratory failure EXAM: PORTABLE CHEST 1 VIEW COMPARISON:  Two days ago FINDINGS: Stable cardiopericardial enlargement and aortic tortuosity. Clips noted at distorted right hilum, history of partial lobectomy. Ipsilateral cardiophrenic sulcus blunting is likely scarring and is stable. No generalized Kerley lines or air bronchogram. No pneumothorax. Scoliosis. Coronary stenting. IMPRESSION: Cardiomegaly and vascular congestion. No convincing change from 2 days ago Electronically Signed   By: Monte Fantasia M.D.   On: 04/15/2016 07:08   Dg Chest Port 1 View  Result Date: 04/13/2016 CLINICAL DATA:  84 year old female with shortness of breath. COPD exacerbation. Subsequent encounter. EXAM: PORTABLE CHEST 1 VIEW COMPARISON:  04/11/2016. FINDINGS: Mild cardiomegaly.  Coronary artery calcifications. Calcified slightly tortuous aorta. Pulmonary vascular prominence most notable centrally. Limited evaluation of retrocardiac region otherwise no segmental consolidation noted. No plain film evidence of pulmonary malignancy. Blunting right costophrenic angle stable. Question small pleural effusion versus scarring. No remote exams available to determine if this represents a change. Scoliosis thoracic spine convex right. IMPRESSION: Overall similar appearance to 04/11/2016 exam as noted above. Electronically Signed   By: Genia Del M.D.   On: 04/13/2016 08:16    EKG:   Orders placed or performed during the hospital encounter of 04/11/16  .  ED EKG  . ED EKG  . EKG 12-Lead  . EKG 12-Lead  . EKG 12-Lead  . EKG 12-Lead      Management plans discussed with the patient, family and they are in agreement.  CODE STATUS:     Code Status Orders        Start     Ordered   04/14/16 1715  Do not attempt resuscitation (DNR)  Continuous    Question Answer Comment  In the event of cardiac or respiratory ARREST Do not call a "code blue"   In the event of cardiac or respiratory ARREST Do not perform Intubation, CPR, defibrillation or ACLS   In the event of cardiac or respiratory ARREST Use medication by any route, position, wound care, and other measures to relive pain and suffering. May use oxygen, suction and manual treatment of airway obstruction as needed for comfort.      04/14/16 1714    Code Status History    Date Active Date Inactive Code Status Order ID Comments User Context   04/11/2016 11:39 AM 04/14/2016  5:14 PM Full Code [id number]  Dustin Flock, MD Inpatient    Advance Directive Documentation   Flowsheet Row Most Recent Value  Type of Advance Directive  Healthcare Power of Attorney  Pre-existing out of facility DNR order (yellow form or pink MOST form)  No data  "MOST" Form in Place?  No data      TOTAL TIME TAKING CARE OF THIS PATIENT: 90mnutes.   Note: This dictation was prepared with Dragon dictation along with smaller phrase technology. Any transcriptional errors that result from this process are unintentional.   '@MEC'$ @  on 04/16/2016 at 2:23 PM  Between 7am to 6pm - Pager - [phone] After 6pm go to [email or web address] - password [email or web address] [email or web address]  Office  [phone] CC: Primary care physician; MMarcelina Morel MD

## 2016-04-16 NOTE — Progress Notes (Signed)
Report given to [facility] at [facility]. Awaiting transportation. Madlyn Frankel, RN\

## 2016-04-16 NOTE — Clinical Social Work Placement (Signed)
   CLINICAL SOCIAL WORK PLACEMENT  NOTE  Date:  04/16/2016  Patient Details  Name: Mariah Gross MRN: [id number] Date of Birth: 12/13/1929  Clinical Social Work is seeking post-discharge placement for this patient at the [place] level of care (*CSW will initial, date and re-position this form in  chart as items are completed):  Yes   Patient/family provided with [facility] Work Department's list of facilities offering this level of care within the geographic area requested by the patient (or if unable, by the patient's family).  Yes   Patient/family informed of their freedom to choose among providers that offer the needed level of care, that participate in Medicare, Medicaid or managed care program needed by the patient, have an available bed and are willing to accept the patient.  Yes   Patient/family informed of [facility]'s ownership interest in [facility] and [facility], as well as of the fact that they are under no obligation to receive care at these facilities.  PASRR submitted to EDS on 04/14/16 (PASARR could not be submitted and an existing PASARR could not be confirmed because PASARR is closed today. )     PASRR number received on       Existing PASRR number confirmed on 04/16/16     FL2 transmitted to all facilities in geographic area requested by pt/family on 04/14/16     FL2 transmitted to all facilities within larger geographic area on       Patient informed that his/her managed care company has contracts with or will negotiate with certain facilities, including the following:        Yes   Patient/family informed of bed offers received.  Patient chooses bed at  [facility] )     Physician recommends and patient chooses bed at      Patient to be transferred to  [facility] ) on 04/16/16.  Patient to be transferred to facility by  [facility] )     Patient family notified on 04/16/16 of transfer.  Name  of family member notified:   (Patient's daughter [name] is aware of D/C today. )     PHYSICIAN       Additional Comment:    _______________________________________________ Cornesha Radziewicz, Veronia Beets, LCSW 04/16/2016, 3:40 PM

## 2016-04-16 NOTE — Progress Notes (Signed)
Physical Therapy Treatment Patient Details Name: Mariah Gross MRN: [id number] DOB: Jul 07, 1929 Today's Date: 04/16/2016    History of Present Illness Mariah Gross  is a 83 y.o. female with a known history of COPD on chronic oxygen therapy ranging from 3-4 L based on activity, coronary artery disease, hypercholesterolemia, hyperparathyroidism, essential hypertension, history lungs cancer, obstructive sleep apnea  intolerant with CPAP was coming to the hospital with complaining of shortness of breath ongoing for the past 3-4 days. Patient has had progressive shortness of breath at rest and activity. She also describes a rattling sound in her lungs. She has had some cough which is yellow in color. She also complains of swelling of her lower extremity. Denies any nausea vomiting or diarrhea. She complains of chest pain with coughing    PT Comments    Pt able to slightly increase her ambulation distance on this date but is still very quickly fatigued. SaO2 drops to 88% on 4L/min O2 but rebounds within 30-45s with pursed lip breathing to 90%. She is unsteady with transfers and in standing requiring intermittent minA+1 to remain upright due to posterior leaning. Pt will need SNF placement prior to return to independent living due to deconditioning, poor balance, and high fall risk.   Follow Up Recommendations  SNF     Equipment Recommendations  None recommended by PT    Recommendations for Other Services       Precautions / Restrictions Precautions Precautions: Fall Restrictions Weight Bearing Restrictions: No    Mobility  Bed Mobility Overal bed mobility: Modified Independent             General bed mobility comments: HOB minimally elevated and bed rails utilized. Increased time required to perform  Transfers Overall transfer level: Needs assistance Equipment used: Rolling walker (2 wheeled) Transfers: Sit to/from Stand Sit to Stand: Min assist         General transfer  comment: Pt requires minA+1 for balance with sit to stand transfers. She leans posteriorly requiring assist to prevent her from falling. Performed transfers to/from [facility] as well as bed. Safe hand placement demonstrated  Ambulation/Gait Ambulation/Gait assistance: Min assist Ambulation Distance (Feet): 80 Feet Assistive device: Rolling walker (2 wheeled) Gait Pattern/deviations: Step-through pattern;Decreased step length - right;Decreased step length - left Gait velocity: Decreased Gait velocity interpretation: Below normal speed for age/gender General Gait Details: Pt ambulates approximately 1 length of RN station and back to bed. She requires multiple standing rest break with pursed lip breathing. SaO2 drops to 88% on 4L/min O2 but rebounds within 30-45 seconds to 90%. Pt very quickly fatigued with ambulation. During one standing rest break pt lets go of walker and starts to fall backwards requiring minA+1 to prevent LOB. Pt reports 7/10 DOE on BORG at end of ambulation distance   Financial trader Rankin (Stroke Patients Only)       Balance Overall balance assessment: Needs assistance Sitting-balance support: No upper extremity supported Sitting balance-Leahy Scale: Good     Standing balance support: No upper extremity supported Standing balance-Leahy Scale: Fair Standing balance comment: Fair balance in wide stance                    Cognition Arousal/Alertness: Awake/alert Behavior During Therapy: WFL for tasks assessed/performed Overall Cognitive Status: Within Functional Limits for tasks assessed  Exercises      General Comments        Pertinent Vitals/Pain Pain Assessment: No/denies pain    Home Living                      Prior Function            PT Goals (current goals can now be found in the care plan section) Acute Rehab PT Goals Patient Stated Goal: Return to prior  function at her independent living apartment PT Goal Formulation: With patient Time For Goal Achievement: 04/28/16 Potential to Achieve Goals: Good Progress towards PT goals: Progressing toward goals    Frequency    Min 2X/week      PT Plan Current plan remains appropriate    Co-evaluation             End of Session Equipment Utilized During Treatment: Gait belt;Oxygen;Other (comment) (4L/min O2) Activity Tolerance: Patient limited by fatigue Patient left: in bed;with call bell/phone within reach;with bed alarm set     Time: 7915-0569 PT Time Calculation (min) (ACUTE ONLY): 18 min  Charges:  $Gait Training: 8-22 mins                    G Codes:      Lyndel Safe Peng Thorstenson PT, DPT   Kaydenn Mclear 04/16/2016, 2:28 PM

## 2016-04-16 NOTE — NC FL2 (Signed)
[place] LEVEL OF CARE SCREENING TOOL     IDENTIFICATION  Patient Name: Mariah Gross Birthdate: 1929-05-01 Sex: female Admission Date (Current Location): 04/11/2016  [facility] and [place] Number:  Engineering geologist and Address:  [facility], [address], [place], [place] [address]      Provider Number: [id number]  Attending Physician Name and Address:  Nicholes Mango, MD  Relative Name and Phone Number:       Current Level of Care: Hospital Recommended Level of Care: [place] Prior Approval Number:    Date Approved/Denied:   PASRR Number:    Discharge Plan: SNF    Current Diagnoses: Patient Active Problem List   Diagnosis Date Noted  . COPD (chronic obstructive pulmonary disease) with acute bronchitis ([place]) 04/11/2016    Orientation RESPIRATION BLADDER Height & Weight     Self, Time, Situation, Place  O2 (4 Liters Oxygen Chronic ) Continent Weight: 197 lb 6.4 oz (89.5 kg) Height:  '5\' 3"'$  (160 cm)  BEHAVIORAL SYMPTOMS/MOOD NEUROLOGICAL BOWEL NUTRITION STATUS   (none)  (none) Continent Diet (Regular Diet )  AMBULATORY STATUS COMMUNICATION OF NEEDS Skin   Extensive Assist Verbally Normal                       Personal Care Assistance Level of Assistance  Bathing, Feeding, Dressing Bathing Assistance: Limited assistance Feeding assistance: Independent Dressing Assistance: Limited assistance     Functional Limitations Info  Sight, Hearing, Speech Sight Info: Adequate Hearing Info: Adequate Speech Info: Adequate    SPECIAL CARE FACTORS FREQUENCY  PT (By licensed PT), OT (By licensed OT)     PT Frequency:  (5) OT Frequency:  (5)            Contractures      Additional Factors Info  Code Status, Allergies Code Status Info:  (Full Code. ) Allergies Info:  (Ace Inhibitors, Fluconazole, Levaquin Levofloxacin In D5w, Penicillins)           Current Medications (04/16/2016):  This is the  current hospital active medication list Current Facility-Administered Medications  Medication Dose Route Frequency Provider Last Rate Last Dose  . 0.9 %  sodium chloride infusion  250 mL Intravenous PRN Dustin Flock, MD      . acetaminophen (TYLENOL) tablet 650 mg  650 mg Oral Q6H PRN Dustin Flock, MD   650 mg at 04/12/16 0555   Or  . acetaminophen (TYLENOL) suppository 650 mg  650 mg Rectal Q6H PRN Dustin Flock, MD      . amLODipine (NORVASC) tablet 5 mg  5 mg Oral Daily Dustin Flock, MD   5 mg at 04/16/16 1127  . aspirin EC tablet 81 mg  81 mg Oral Daily Dustin Flock, MD   81 mg at 04/16/16 1127  . atorvastatin (LIPITOR) tablet 80 mg  80 mg Oral Daily Dustin Flock, MD   80 mg at 04/15/16 1632  . budesonide (PULMICORT) nebulizer solution 0.25 mg  0.25 mg Nebulization BID Dustin Flock, MD   0.25 mg at 04/16/16 0729  . doxycycline (VIBRA-TABS) tablet 100 mg  100 mg Oral Q12H Nicholes Mango, MD   100 mg at 04/16/16 1127  . guaiFENesin-codeine 100-10 MG/5ML solution 10 mL  10 mL Oral Q4H PRN Dustin Flock, MD   10 mL at 04/15/16 1632  . ipratropium-albuterol (DUONEB) 0.5-2.5 (3) MG/3ML nebulizer solution 3 mL  3 mL Nebulization Q6H PRN Awilda Bill, NP  3 mL at 04/15/16 0915  . levalbuterol (XOPENEX) nebulizer solution 1.25 mg  1.25 mg Nebulization Q4H Awilda Bill, NP   1.25 mg at 04/16/16 1119  . magnesium oxide (MAG-OX) tablet 400 mg  400 mg Oral Daily Dustin Flock, MD   400 mg at 04/13/16 0905  . methylPREDNISolone sodium succinate (SOLU-MEDROL) 40 mg/mL injection 40 mg  40 mg Intravenous Q8H Nicholes Mango, MD   40 mg at 04/16/16 0601  . metoprolol succinate (TOPROL-XL) 24 hr tablet 50 mg  50 mg Oral Daily Dustin Flock, MD   50 mg at 04/16/16 1127  . multivitamin with minerals tablet 1 tablet  1 tablet Oral Daily Merilyn Baba, RPH   1 tablet at 04/15/16 1632  . nitroGLYCERIN (NITROSTAT) SL tablet 0.4 mg  0.4 mg Sublingual Q5 min PRN Dustin Flock, MD   0.4 mg at 04/12/16  0552  . ondansetron (ZOFRAN) tablet 4 mg  4 mg Oral Q6H PRN Dustin Flock, MD       Or  . ondansetron (ZOFRAN) injection 4 mg  4 mg Intravenous Q6H PRN Dustin Flock, MD      . phenol-menthol (CEPASTAT) lozenge 1 lozenge  1 lozenge Buccal PRN Demetrios Loll, MD   1 lozenge at 04/16/16 1132  . sertraline (ZOLOFT) tablet 25 mg  25 mg Oral Daily Dustin Flock, MD   25 mg at 04/16/16 1127  . sodium chloride flush (NS) 0.9 % injection 3 mL  3 mL Intravenous Q12H Dustin Flock, MD   3 mL at 04/16/16 0017  . sodium chloride flush (NS) 0.9 % injection 3 mL  3 mL Intravenous Q12H Dustin Flock, MD   3 mL at 04/16/16 1132  . sodium chloride flush (NS) 0.9 % injection 3 mL  3 mL Intravenous PRN Dustin Flock, MD      . spironolactone (ALDACTONE) tablet 25 mg  25 mg Oral Daily Nicholes Mango, MD   25 mg at 04/16/16 1127     Discharge Medications: Please see discharge summary for a list of discharge medications.  Relevant Imaging Results:  Relevant Lab Results:   Additional Information  (SSN: [id number])  Sample, Veronia Beets, LCSW

## 2016-04-16 NOTE — Discharge Instructions (Signed)
Continue oxygen via nasal cannula Outpatient follow-up with the primary care physician at the facility in 2-3 days Outpatient follow-up with pulmonology Dr. Fabio Bering in a week

## 2016-04-16 NOTE — Progress Notes (Signed)
Patient discharged to [facility] via EMS. Madlyn Frankel, RN

## 2016-04-16 NOTE — Progress Notes (Signed)
EMS called for transport to [facility]. Madlyn Frankel, RN

## 2016-04-16 NOTE — Progress Notes (Signed)
Patient is medically stable for D/C to [facility] today. Per [facility] admissions coordinator at [facility] authorization has been received and patient can come to room 407. RN will call report and arrange EMS for transport. Clinical Education officer, museum (CSW) sent D/C orders to [employer] via [place]. Patient is aware of above. CSW contacted patient's daughter [name] and made her aware of above. Please reconsult if future social work needs arise. CSW signing off.   [employer], LCSW [phone]

## 2016-04-28 NOTE — Progress Notes (Signed)
   04/16/16 1429  PT Time Calculation  PT Start Time (ACUTE ONLY) 1250  PT Stop Time (ACUTE ONLY) 1315  PT Time Calculation (min) (ACUTE ONLY) 25 min  PT G-Codes **NOT FOR INPATIENT CLASS**  Functional Assessment Tool Used clinical judgement  Functional Limitation Mobility: Walking and moving around  Mobility: Walking and Moving Around Current Status (B5102) CK  Mobility: Walking and Moving Around Goal Status (H8527) CJ  PT General Charges  $$ ACUTE PT VISIT 1 Procedure  PT Evaluation  $PT Eval Low Complexity 1 Procedure  PT Treatments  $Gait Training 8-22 mins   Late entry G-Codes entered by Phillips Grout PT, DPT  after chart review. Assessment performed and documented by Phillips Grout PT, DPT     Phillips Grout PT, DPT   5:26 PM 04/28/16

## 2016-05-24 ENCOUNTER — Inpatient Hospital Stay: Payer: Medicare HMO | Admitting: Pulmonary Disease

## 2016-09-26 DEATH — deceased

## 2017-09-20 IMAGING — DX DG CHEST 1V PORT
1 series · 1 of 1 positions shown · non-contrast
Comparison: Two days ago

CLINICAL DATA: Acute respiratory failure

EXAM:
PORTABLE CHEST 1 VIEW

[chest ap]
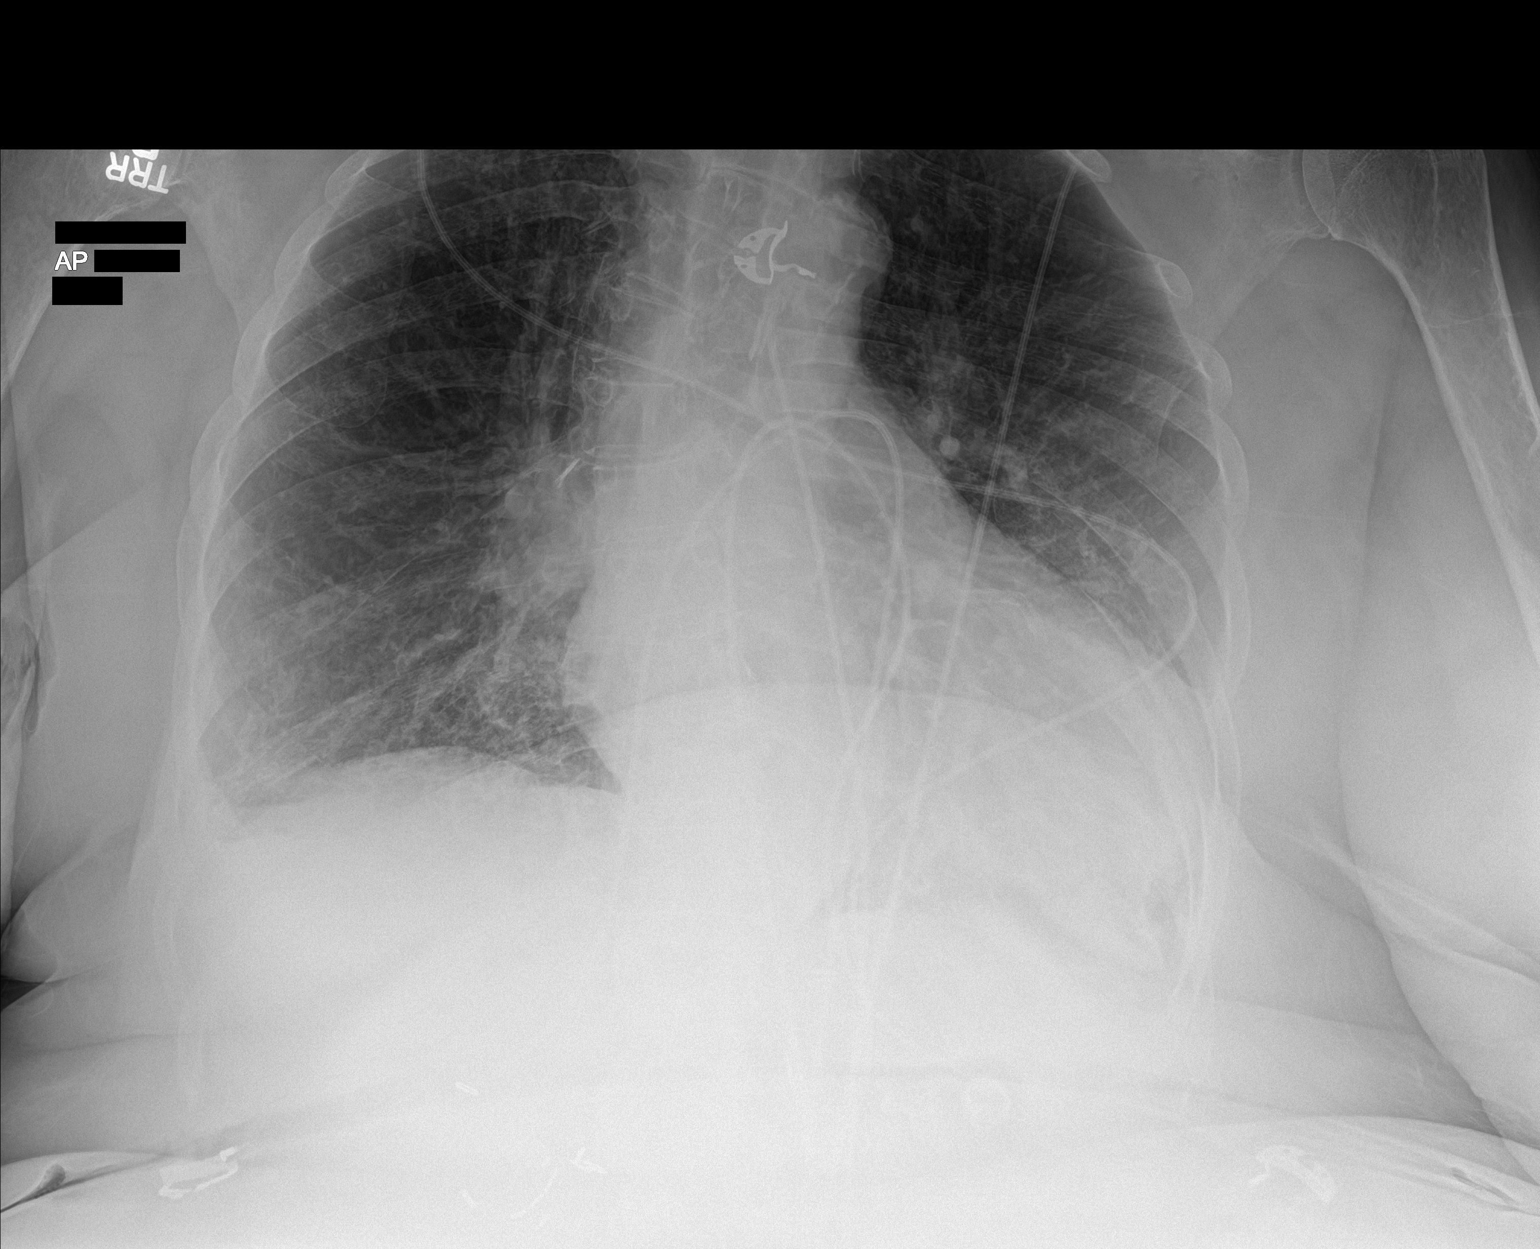

[1 of 1 positions shown; findings below may reference images not displayed]

FINDINGS: Stable cardiopericardial enlargement and aortic tortuosity.

Clips noted at distorted right hilum, history of partial lobectomy.
Ipsilateral cardiophrenic sulcus blunting is likely scarring and is
stable. No generalized Kerley lines or air bronchogram. No
pneumothorax. Scoliosis. Coronary stenting.
IMPRESSION: Cardiomegaly and vascular congestion. No convincing change from 2
days ago
